# Patient Record
Sex: Female | Born: 1935 | Race: White | Hispanic: No | Marital: Married | Smoking: Never smoker
Health system: Southern US, Community
[De-identification: ages and names within clinical notes are randomized; demographics above are authoritative.]

## PROBLEM LIST (undated history)

## (undated) DIAGNOSIS — F0390 Unspecified dementia without behavioral disturbance: Secondary | ICD-10-CM

## (undated) DIAGNOSIS — A630 Anogenital (venereal) warts: Secondary | ICD-10-CM

## (undated) DIAGNOSIS — I6381 Other cerebral infarction due to occlusion or stenosis of small artery: Secondary | ICD-10-CM

## (undated) DIAGNOSIS — Z780 Asymptomatic menopausal state: Secondary | ICD-10-CM

## (undated) DIAGNOSIS — E785 Hyperlipidemia, unspecified: Secondary | ICD-10-CM

## (undated) DIAGNOSIS — I10 Essential (primary) hypertension: Secondary | ICD-10-CM

## (undated) DIAGNOSIS — E559 Vitamin D deficiency, unspecified: Secondary | ICD-10-CM

## (undated) DIAGNOSIS — L719 Rosacea, unspecified: Secondary | ICD-10-CM

## (undated) DIAGNOSIS — M810 Age-related osteoporosis without current pathological fracture: Secondary | ICD-10-CM

## (undated) DIAGNOSIS — R339 Retention of urine, unspecified: Secondary | ICD-10-CM

## (undated) DIAGNOSIS — I639 Cerebral infarction, unspecified: Secondary | ICD-10-CM

## (undated) HISTORY — DX: Unspecified dementia, unspecified severity, without behavioral disturbance, psychotic disturbance, mood disturbance, and anxiety: F03.90

## (undated) HISTORY — DX: Essential (primary) hypertension: I10

## (undated) HISTORY — DX: Cerebral infarction, unspecified: I63.9

## (undated) HISTORY — DX: Retention of urine, unspecified: R33.9

## (undated) HISTORY — DX: Anogenital (venereal) warts: A63.0

## (undated) HISTORY — DX: Asymptomatic menopausal state: Z78.0

## (undated) HISTORY — DX: Hyperlipidemia, unspecified: E78.5

## (undated) HISTORY — DX: Other cerebral infarction due to occlusion or stenosis of small artery: I63.81

## (undated) HISTORY — DX: Vitamin D deficiency, unspecified: E55.9

## (undated) HISTORY — DX: Age-related osteoporosis without current pathological fracture: M81.0

## (undated) HISTORY — DX: Rosacea, unspecified: L71.9

---

## 2004-09-02 ENCOUNTER — Ambulatory Visit: Payer: Self-pay | Admitting: Internal Medicine

## 2004-09-02 LAB — HM COLONOSCOPY

## 2005-10-27 LAB — HM PAP SMEAR

## 2006-04-11 ENCOUNTER — Ambulatory Visit: Payer: Self-pay | Admitting: Chiropractic Medicine

## 2013-04-30 ENCOUNTER — Observation Stay: Payer: Self-pay | Admitting: Internal Medicine

## 2013-04-30 LAB — BASIC METABOLIC PANEL
Anion Gap: 5 — ABNORMAL LOW (ref 7–16)
BUN: 14 mg/dL (ref 7–18)
Calcium, Total: 9.2 mg/dL (ref 8.5–10.1)
Co2: 28 mmol/L (ref 21–32)
Creatinine: 0.84 mg/dL (ref 0.60–1.30)
EGFR (African American): >60
EGFR (Non-African Amer.): >60
Glucose: 119 mg/dL — ABNORMAL HIGH (ref 65–99)
Osmolality: 277 (ref 275–301)
Potassium: 3.6 mmol/L (ref 3.5–5.1)

## 2013-04-30 LAB — CBC WITH DIFFERENTIAL/PLATELET
Basophil #: 0 10*3/uL (ref 0.0–0.1)
Basophil %: 0.3 %
Eosinophil #: 0.1 10*3/uL (ref 0.0–0.7)
Eosinophil %: 1.1 %
HCT: 41.9 % (ref 35.0–47.0)
HGB: 14.3 g/dL (ref 12.0–16.0)
Lymphocyte %: 16.5 %
MCH: 29.9 pg (ref 26.0–34.0)
MCHC: 34.1 g/dL (ref 32.0–36.0)
MCV: 88 fL (ref 80–100)
Monocyte #: 0.7 x10 3/mm (ref 0.2–0.9)
Monocyte %: 7.6 %
Neutrophil #: 7.3 10*3/uL — ABNORMAL HIGH (ref 1.4–6.5)
Neutrophil %: 74.5 %
RBC: 4.77 10*6/uL (ref 3.80–5.20)
RDW: 12.8 % (ref 11.5–14.5)

## 2013-04-30 LAB — TROPONIN I: Troponin-I: <0.02 ng/mL

## 2013-04-30 LAB — URINALYSIS, COMPLETE
Bacteria: NONE SEEN
Blood: NEGATIVE
Glucose,UR: NEGATIVE mg/dL (ref 0–75)
Hyaline Cast: 2
Ketone: NEGATIVE
Nitrite: NEGATIVE
RBC,UR: 1 /HPF (ref 0–5)
Squamous Epithelial: NONE SEEN
WBC UR: 1 /HPF (ref 0–5)

## 2013-06-02 HISTORY — PX: SUPRAPUBIC CATHETER PLACEMENT: SHX2473

## 2013-06-18 ENCOUNTER — Emergency Department: Payer: Self-pay | Admitting: Emergency Medicine

## 2013-06-18 LAB — CBC
HCT: 41.7 % (ref 35.0–47.0)
MCH: 29.5 pg (ref 26.0–34.0)
MCHC: 34.3 g/dL (ref 32.0–36.0)
MCV: 86 fL (ref 80–100)
RBC: 4.86 10*6/uL (ref 3.80–5.20)
RDW: 12.8 % (ref 11.5–14.5)
WBC: 18.3 10*3/uL — ABNORMAL HIGH (ref 3.6–11.0)

## 2013-06-18 LAB — COMPREHENSIVE METABOLIC PANEL
Albumin: 4.1 g/dL (ref 3.4–5.0)
Alkaline Phosphatase: 92 U/L (ref 50–136)
Anion Gap: 8 (ref 7–16)
BUN: 20 mg/dL — ABNORMAL HIGH (ref 7–18)
EGFR (Non-African Amer.): 39 — ABNORMAL LOW
Glucose: 121 mg/dL — ABNORMAL HIGH (ref 65–99)
Osmolality: 267 (ref 275–301)
SGOT(AST): 51 U/L — ABNORMAL HIGH (ref 15–37)
SGPT (ALT): 31 U/L (ref 12–78)
Sodium: 131 mmol/L — ABNORMAL LOW (ref 136–145)

## 2013-06-18 LAB — URINALYSIS, COMPLETE
Bacteria: NONE SEEN
Bilirubin,UR: NEGATIVE
Glucose,UR: NEGATIVE mg/dL (ref 0–75)
Ketone: NEGATIVE
Leukocyte Esterase: NEGATIVE
Nitrite: POSITIVE
Ph: 6 (ref 4.5–8.0)
Protein: NEGATIVE
RBC,UR: 132 /HPF (ref 0–5)
WBC UR: 5 /HPF (ref 0–5)

## 2013-06-19 LAB — URINE CULTURE

## 2013-06-26 ENCOUNTER — Emergency Department: Payer: Self-pay | Admitting: Emergency Medicine

## 2013-06-29 ENCOUNTER — Emergency Department: Payer: Self-pay | Admitting: Emergency Medicine

## 2013-06-29 LAB — URINALYSIS, COMPLETE
Ketone: NEGATIVE
Nitrite: NEGATIVE
Ph: 5 (ref 4.5–8.0)
RBC,UR: 249 /HPF (ref 0–5)
Specific Gravity: 1.018 (ref 1.003–1.030)
Squamous Epithelial: 3
WBC UR: 21 /HPF (ref 0–5)

## 2013-06-30 ENCOUNTER — Ambulatory Visit: Payer: Self-pay | Admitting: Urology

## 2013-06-30 LAB — CBC WITH DIFFERENTIAL/PLATELET
Basophil #: 0.1 10*3/uL (ref 0.0–0.1)
Eosinophil #: 0.4 10*3/uL (ref 0.0–0.7)
Eosinophil %: 5.1 %
HCT: 39.1 % (ref 35.0–47.0)
HGB: 13.4 g/dL (ref 12.0–16.0)
Lymphocyte %: 17.4 %
MCH: 30.3 pg (ref 26.0–34.0)
MCV: 88 fL (ref 80–100)
Monocyte %: 11.2 %
Neutrophil #: 5.5 10*3/uL (ref 1.4–6.5)
Platelet: 310 10*3/uL (ref 150–440)
RBC: 4.43 10*6/uL (ref 3.80–5.20)

## 2013-06-30 LAB — BASIC METABOLIC PANEL
Calcium, Total: 9 mg/dL (ref 8.5–10.1)
Co2: 31 mmol/L (ref 21–32)
EGFR (African American): >60
Osmolality: 271 (ref 275–301)
Sodium: 136 mmol/L (ref 136–145)

## 2013-07-07 ENCOUNTER — Observation Stay: Payer: Self-pay | Admitting: Internal Medicine

## 2013-07-07 LAB — COMPREHENSIVE METABOLIC PANEL
Alkaline Phosphatase: 106 U/L
Anion Gap: 9 (ref 7–16)
Calcium, Total: 8.9 mg/dL (ref 8.5–10.1)
Co2: 27 mmol/L (ref 21–32)
Creatinine: 0.64 mg/dL (ref 0.60–1.30)
EGFR (African American): >60
Glucose: 96 mg/dL (ref 65–99)
Potassium: 3.3 mmol/L — ABNORMAL LOW (ref 3.5–5.1)
SGOT(AST): 20 U/L (ref 15–37)
SGPT (ALT): 17 U/L (ref 12–78)
Sodium: 137 mmol/L (ref 136–145)
Total Protein: 6.1 g/dL — ABNORMAL LOW (ref 6.4–8.2)

## 2013-07-07 LAB — URINALYSIS, COMPLETE
Bacteria: NONE SEEN
Glucose,UR: NEGATIVE mg/dL (ref 0–75)
Nitrite: NEGATIVE
Protein: 100
RBC,UR: 1061 /HPF (ref 0–5)
Specific Gravity: 1.024 (ref 1.003–1.030)
WBC UR: 90 /HPF (ref 0–5)

## 2013-07-07 LAB — CBC WITH DIFFERENTIAL/PLATELET
Basophil %: 0.7 %
HCT: 37.6 % (ref 35.0–47.0)
Lymphocyte #: 1.6 10*3/uL (ref 1.0–3.6)
MCHC: 35 g/dL (ref 32.0–36.0)
MCV: 87 fL (ref 80–100)
Monocyte #: 0.9 x10 3/mm (ref 0.2–0.9)
Neutrophil #: 5.4 10*3/uL (ref 1.4–6.5)
Platelet: 280 10*3/uL (ref 150–440)
RBC: 4.31 10*6/uL (ref 3.80–5.20)
WBC: 8.4 10*3/uL (ref 3.6–11.0)

## 2013-07-08 ENCOUNTER — Ambulatory Visit: Payer: Self-pay | Admitting: Urology

## 2013-07-08 LAB — CBC WITH DIFFERENTIAL/PLATELET
Basophil #: 0 10*3/uL (ref 0.0–0.1)
Eosinophil #: 0.3 10*3/uL (ref 0.0–0.7)
Eosinophil %: 6.3 %
HGB: 12.9 g/dL (ref 12.0–16.0)
Lymphocyte #: 1.4 10*3/uL (ref 1.0–3.6)
Lymphocyte %: 27 %
MCH: 30.4 pg (ref 26.0–34.0)
MCV: 88 fL (ref 80–100)
Monocyte %: 13 %
Neutrophil #: 2.7 10*3/uL (ref 1.4–6.5)
RBC: 4.24 10*6/uL (ref 3.80–5.20)

## 2013-07-08 LAB — BASIC METABOLIC PANEL
BUN: 4 mg/dL — ABNORMAL LOW (ref 7–18)
Calcium, Total: 8.7 mg/dL (ref 8.5–10.1)
Chloride: 110 mmol/L — ABNORMAL HIGH (ref 98–107)
Co2: 30 mmol/L (ref 21–32)
Creatinine: 0.61 mg/dL (ref 0.60–1.30)
Glucose: 87 mg/dL (ref 65–99)
Osmolality: 278 (ref 275–301)
Potassium: 3.8 mmol/L (ref 3.5–5.1)
Sodium: 141 mmol/L (ref 136–145)

## 2013-07-13 ENCOUNTER — Emergency Department: Payer: Self-pay | Admitting: Emergency Medicine

## 2013-07-13 LAB — CBC WITH DIFFERENTIAL/PLATELET
Basophil #: 0 10*3/uL (ref 0.0–0.1)
Eosinophil #: 0.2 10*3/uL (ref 0.0–0.7)
HGB: 14.8 g/dL (ref 12.0–16.0)
Lymphocyte #: 1.4 10*3/uL (ref 1.0–3.6)
Lymphocyte %: 19.5 %
MCH: 29.9 pg (ref 26.0–34.0)
Monocyte %: 11.7 %
Neutrophil #: 4.7 10*3/uL (ref 1.4–6.5)
Neutrophil %: 65.3 %
Platelet: 304 10*3/uL (ref 150–440)
RDW: 12.7 % (ref 11.5–14.5)
WBC: 7.2 10*3/uL (ref 3.6–11.0)

## 2013-07-13 LAB — BASIC METABOLIC PANEL
Anion Gap: 6 — ABNORMAL LOW (ref 7–16)
BUN: 10 mg/dL (ref 7–18)
Co2: 25 mmol/L (ref 21–32)
EGFR (African American): >60
EGFR (Non-African Amer.): >60
Glucose: 94 mg/dL (ref 65–99)
Sodium: 134 mmol/L — ABNORMAL LOW (ref 136–145)

## 2013-10-25 ENCOUNTER — Emergency Department: Payer: Self-pay | Admitting: Emergency Medicine

## 2013-10-26 ENCOUNTER — Emergency Department: Payer: Self-pay | Admitting: Emergency Medicine

## 2013-10-27 LAB — CBC WITH DIFFERENTIAL/PLATELET
Basophil #: 0.1 10*3/uL (ref 0.0–0.1)
Basophil %: 0.7 %
Eosinophil #: 0.1 10*3/uL (ref 0.0–0.7)
Eosinophil %: 1.4 %
HCT: 39.3 % (ref 35.0–47.0)
HGB: 13.4 g/dL (ref 12.0–16.0)
Lymphocyte #: 1.6 10*3/uL (ref 1.0–3.6)
Lymphocyte %: 18.3 %
MCH: 30.2 pg (ref 26.0–34.0)
MCHC: 34.2 g/dL (ref 32.0–36.0)
MCV: 88 fL (ref 80–100)
MONO ABS: 0.9 x10 3/mm (ref 0.2–0.9)
Monocyte %: 10.8 %
Neutrophil #: 6 10*3/uL (ref 1.4–6.5)
Neutrophil %: 68.8 %
PLATELETS: 247 10*3/uL (ref 150–440)
RBC: 4.45 10*6/uL (ref 3.80–5.20)
RDW: 13.4 % (ref 11.5–14.5)
WBC: 8.7 10*3/uL (ref 3.6–11.0)

## 2013-10-27 LAB — BASIC METABOLIC PANEL
ANION GAP: 8 (ref 7–16)
BUN: 10 mg/dL (ref 7–18)
CO2: 25 mmol/L (ref 21–32)
CREATININE: 0.79 mg/dL (ref 0.60–1.30)
Calcium, Total: 8.6 mg/dL (ref 8.5–10.1)
Chloride: 104 mmol/L (ref 98–107)
EGFR (Non-African Amer.): >60
Glucose: 78 mg/dL (ref 65–99)
Osmolality: 272 (ref 275–301)
POTASSIUM: 3.5 mmol/L (ref 3.5–5.1)
SODIUM: 137 mmol/L (ref 136–145)

## 2013-10-27 LAB — URINALYSIS, COMPLETE
BACTERIA: NONE SEEN
Bilirubin,UR: NEGATIVE
GLUCOSE, UR: NEGATIVE mg/dL (ref 0–75)
Ketone: NEGATIVE
LEUKOCYTE ESTERASE: NEGATIVE
Nitrite: NEGATIVE
Ph: 7 (ref 4.5–8.0)
Protein: NEGATIVE
Specific Gravity: 1.002 (ref 1.003–1.030)
Squamous Epithelial: NONE SEEN

## 2013-12-17 ENCOUNTER — Ambulatory Visit: Payer: Self-pay | Admitting: Family Medicine

## 2014-02-27 ENCOUNTER — Observation Stay: Payer: Self-pay | Admitting: Internal Medicine

## 2014-02-27 LAB — CBC WITH DIFFERENTIAL/PLATELET
BASOS ABS: 0 10*3/uL (ref 0.0–0.1)
Basophil %: 0.3 %
Eosinophil #: 0.2 10*3/uL (ref 0.0–0.7)
Eosinophil %: 3.1 %
HCT: 43.6 % (ref 35.0–47.0)
HGB: 14.2 g/dL (ref 12.0–16.0)
LYMPHS ABS: 1.4 10*3/uL (ref 1.0–3.6)
LYMPHS PCT: 19.7 %
MCH: 29.6 pg (ref 26.0–34.0)
MCHC: 32.5 g/dL (ref 32.0–36.0)
MCV: 91 fL (ref 80–100)
MONO ABS: 0.7 x10 3/mm (ref 0.2–0.9)
Monocyte %: 9.6 %
NEUTROS PCT: 67.3 %
Neutrophil #: 4.9 10*3/uL (ref 1.4–6.5)
Platelet: 204 10*3/uL (ref 150–440)
RBC: 4.8 10*6/uL (ref 3.80–5.20)
RDW: 13 % (ref 11.5–14.5)
WBC: 7.3 10*3/uL (ref 3.6–11.0)

## 2014-02-27 LAB — URINALYSIS, COMPLETE
BILIRUBIN, UR: NEGATIVE
Blood: NEGATIVE
Glucose,UR: NEGATIVE mg/dL (ref 0–75)
Nitrite: NEGATIVE
Ph: 5 (ref 4.5–8.0)
Protein: NEGATIVE
Specific Gravity: 1.024 (ref 1.003–1.030)
WBC UR: 2 /HPF (ref 0–5)

## 2014-02-27 LAB — BASIC METABOLIC PANEL
Anion Gap: 6 — ABNORMAL LOW (ref 7–16)
BUN: 14 mg/dL (ref 7–18)
CREATININE: 0.7 mg/dL (ref 0.60–1.30)
Calcium, Total: 8.8 mg/dL (ref 8.5–10.1)
Chloride: 105 mmol/L (ref 98–107)
Co2: 30 mmol/L (ref 21–32)
EGFR (African American): >60
GLUCOSE: 82 mg/dL (ref 65–99)
Osmolality: 281 (ref 275–301)
POTASSIUM: 4 mmol/L (ref 3.5–5.1)
SODIUM: 141 mmol/L (ref 136–145)

## 2014-02-28 LAB — CBC WITH DIFFERENTIAL/PLATELET
Basophil #: 0.1 10*3/uL (ref 0.0–0.1)
Basophil %: 0.6 %
Eosinophil #: 0.2 10*3/uL (ref 0.0–0.7)
Eosinophil %: 2.7 %
HCT: 42.2 % (ref 35.0–47.0)
HGB: 14 g/dL (ref 12.0–16.0)
Lymphocyte #: 1.4 10*3/uL (ref 1.0–3.6)
Lymphocyte %: 16.6 %
MCH: 30.3 pg (ref 26.0–34.0)
MCHC: 33.2 g/dL (ref 32.0–36.0)
MCV: 91 fL (ref 80–100)
Monocyte #: 0.8 x10 3/mm (ref 0.2–0.9)
Monocyte %: 9 %
Neutrophil #: 6 10*3/uL (ref 1.4–6.5)
Neutrophil %: 71.1 %
PLATELETS: 196 10*3/uL (ref 150–440)
RBC: 4.61 10*6/uL (ref 3.80–5.20)
RDW: 13.2 % (ref 11.5–14.5)
WBC: 8.4 10*3/uL (ref 3.6–11.0)

## 2014-02-28 LAB — BASIC METABOLIC PANEL
Anion Gap: 9 (ref 7–16)
BUN: 11 mg/dL (ref 7–18)
Calcium, Total: 8.5 mg/dL (ref 8.5–10.1)
Chloride: 106 mmol/L (ref 98–107)
Co2: 26 mmol/L (ref 21–32)
Creatinine: 0.61 mg/dL (ref 0.60–1.30)
Glucose: 86 mg/dL (ref 65–99)
OSMOLALITY: 280 (ref 275–301)
Potassium: 3.6 mmol/L (ref 3.5–5.1)
SODIUM: 141 mmol/L (ref 136–145)

## 2014-11-22 NOTE — H&P (Signed)
PATIENT NAME:  [name], Felicia S MR#:  [id number] DATE OF BIRTH:  18-Aug-1927  DATE OF ADMISSION:  07/07/2013  PRIMARY CARE PHYSICIAN:  Dr. Dossie Arbourrissman.  REFERRING EMERGENCY ROOM PHYSICIAN:  Dr. Manson PasseyBrown.   CHIEF COMPLAINT: Altered mental status, lower abdominal discomfort.   HISTORY OF PRESENT ILLNESS: The patient is an 81 year old pleasant Caucasian female with a history of urinary retention after surgical procedure requiring indwelling Foley catheter,  follows up with Urology, her last visit was yesterday and dementia. She  is presenting to the ER with a chief complaint of confusion and lower abdominal discomfort. The patient lives with her husband, who is demented. The patient also has baseline dementia per ER physician. The patient and her husband came to the ER for lower abdominal discomfort. She is not feverish but having chills. Denies any nausea, vomiting, back pain. CAT scan of the abdomen and pelvis with contrast was ordered, which is pending at this time.   PAST MEDICAL HISTORY: Urinary retention, dementia per the old records.   PAST SURGICAL AND PROCEDURAL HISTORY: Indwelling Foley catheter for urinary retention.  ALLERGIES:  No known drug allergies.   PSYCHOSOCIAL HISTORY: Lives at home with husband, who is demented. No history of  smoking, alcohol or drugs.   FAMILY HISTORY: The patient's dad had history of congestive heart failure.    REVIEW OF SYSTEMS: CONSTITUTIONAL:  Denies any fever, complaining of chills. No fever or fatigue.   EYES:  Denies any blurry vision or double vision.  EARS, NOSE, THROAT: No epistaxis, discharge.  RESPIRATION: Denies cough or COPD.   CARDIOVASCULAR: No chest pain, palpitations.  GASTROINTESTINAL: Denies nausea, vomiting, diarrhea. Complaining of lower abdominal discomfort.  NEUROLOGIC: Denies any vertigo, ataxia. Has dementia. PSYCHIATRIC:  No ADD or OCD.  HOME MEDICATIONS: Cipro 500 mg p.o. q.12 hours, Percocet as needed basis.   PHYSICAL  EXAMINATION: VITAL SIGNS: Temperature 98.6, pulse 81, respirations 20, blood pressure 140/87, pulse oximetry 97%.  GENERAL APPEARANCE: Not under acute distress. Moderately built and nourished.  HEENT: Normocephalic, atraumatic. Pupils are equally reacting to light and accommodation. No scleral icterus. No conjunctival injection. No sinus tenderness. No postnasal drip.  NECK: Supple. No JVD. No thyromegaly. Range of motion is intact.  LUNGS: Clear to auscultation bilaterally. No accessory muscle use and no anterior chest wall tenderness on palpation.  CARDIAC: S1, S2 normal. Regular rate and rhythm. No murmurs. GASTROINTESTINAL: Soft. Bowel sounds are positive in all 4 quadrants. Nontender, nondistended. No hepatosplenomegaly. No masses felt.  NEUROLOGIC: Awake, alert, oriented x 3. Motor and sensory are intact  EXTREMITIES: No edema. No cyanosis. No clubbing.  MUSCULOSKELETAL: No joint effusion, tenderness, erythema. PSYCHIATRIC:  Normal mood and affect.   LABORATORY AND IMAGING STUDIES: CAT scan of the abdomen and pelvis is pending which needs to be followed up by the rounding physician. Except for potassium, BMP looks fine. LFTs are normal except total protein which is low at 6.1, albumin is low at 3.2. CBC: WBC 8.4, hemoglobin is 13.1, hematocrit 37.6, platelets 280. Urinalysis is amber color, cloudy in appearance. Glucose negative, bilirubin negative, ketones negative, specific gravity 1.024, protein 100 mg/dL, nitrite negative, leukocyte esterase 2+.     ASSESSMENT AND PLAN: An 81 year old Caucasian female presenting to the ER with worsening of mentation will be admitted with following assessment and plan : 1.  Altered mental status, probably from acute cystitis. We will admit the patient and get  cultures. The patient will be on IV Rocephin. Continue  indwelling Foley catheter and if  necessary, we will put a Urology consult.  2.  Chronic urinary retention with indwelling Foley catheter. Seen  by Urology yesterday as reported by the patient with chronic history of dementia. We will provide her gastrointestinal and deep vein thrombosis prophylaxis.   CODE STATUS: She is FULL CODE until code status is determined.   TOTAL TIME SPENT ON ADMISSION: 45 minutes.   The diagnosis and plan of care was discussed in detail with the patient and her husband at bedside.     ____________________________ Ramonita Lab, MD ag:dp D: 07/07/2013 06:18:40 ET T: 07/07/2013 07:31:17 ET JOB#: [id number]  cc: Ramonita Lab, MD, <Dictator> Steele Sizer, MD Ramonita Lab MD ELECTRONICALLY SIGNED 07/12/2013 5:48

## 2014-11-22 NOTE — Consult Note (Signed)
Urology Procedure Note: Complicated Foley Catheter Placement (due to altered anatomy)  Chronic Urinary Retention (failed voiding trial this morning - foley d/c'd at 11am - pt unable to void and c/o 10/10 lower abd pain)                 -nursing unable to identify the urethral meatus to replace foley catheter Vaginal atrophy with moderate to severe introital stenosis. Marked edema of the urethral meatus with mild recession and inferolateral orientation toward the left. The pt was prepped/draped sterilely. A 16 French Silicone Foley catheter was placed to straight drainage after the edematous, recessed urethral meatus was identified with manual palpation.  >83000mL of clear amber urine obtained with prompt relief of abd discomfort. Maintain foley catheter until [facility]  Electronic Signatures: Marin OlpKim, Katalin Colledge H (MD)  (Signed on 07-Dec-14 14:55)  Authored  Last Updated: 07-Dec-14 14:55 by Marin OlpKim, Savien Mamula H (MD)

## 2014-11-22 NOTE — H&P (Signed)
PATIENT NAME:  Felicia [name], Felicia [name] MR#:  [id number] DATE OF BIRTH:  05-Mar-1928  DATE OF ADMISSION:  04/30/2013  PRIMARY CARE PHYSICIAN: Dr. Dossie Arbourrissman.   CHIEF COMPLAINT: Back pain after fall yesterday. Felicia [name] is a very pleasant 81 year old Caucasian female with no past medical history, who does not take any medications other than ginkgo biloba comes to the Emergency Room after she accidently rolled over the couch while she was napping yesterday. She has chronic low back pain and since after the fall she thinks that her back got injured and her back has been hurting and she has been weak,  not able to ambulate well. Her son brought her to the Emergency Room where she has been hemodynamically stable. Her x-ray of the AP lumbar spine is negative for any fractures, shows DJD. The patient was evaluated, did received a small dose of Roxicodone 2.5 mg. Thereafter she seemed a little confused "talking some out of context." The patient was seen also by physical therapy and  per PT the patient did not seem to be safe to return home to stay with the husband who has medical problems, along with who uses a rolling walker. The patient does not have any other help at home. Hence, is being admitted for further evaluation and management.   PAST MEDICAL HISTORY: None.   PAST SURGICAL HISTORY: None.   MEDICATIONS: The patient does not take any medications other than ginkgo biloba tablets daily.   ALLERGIES: No known drug allergies.   SOCIAL HISTORY: Lives with her husband. Nonsmoker, nonalcoholic.   FAMILY HISTORY: The patient does not remember and is unable to tell me anything about her family history.   REVIEW OF SYSTEMS:  CONSTITUTIONAL: No fever. Positive for fatigue, weakness and back pain.  EYES: No blurred or double vision, glaucoma or cataracts.   ENT: No tinnitus, ear pain, hearing loss or postnasal drip.  RESPIRATORY: No cough, wheeze, hemoptysis or dyspnea.  CARDIOVASCULAR: No chest pain, orthopnea, edema  or arrhythmia, palpitations.  GASTROINTESTINAL: No nausea, vomiting, diarrhea, abdominal pain or melena.  GENITOURINARY: No dysuria or hematuria.  ENDOCRINE: No polyuria, nocturia or thyroid problems.  HEMATOLOGY: No anemia or easy bruising.  SKIN: No acne or rash.  MUSCULOSKELETAL: Positive for back pain and arthritis. No swelling or gout.  NEUROLOGICAL: No CVA, TIA, dysarthria or dementia. PSYCHOLOGICAL: No anxiety or depression.   All other systems reviewed and negative.   PHYSICAL EXAMINATION: GENERAL: The patient is awake, alert, oriented x 3, not in acute distress.  VITAL SIGNS: Afebrile. Pulse is 55, respirations 18, blood pressure is 133/58, sats are 98% on room air.  HEENT: Atraumatic, normocephalic. Pupils: PERRLA. EOM intact. Oral mucosa is moist.  NECK: Supple. No JVD. No carotid bruit.  LUNGS: Clear to auscultation bilaterally. No rales, rhonchi, respiratory distress or labored breathing.  HEART: Both the heart sounds are normal. Rate, rhythm regular. PMI not lateralized. Chest is nontender.  EXTREMITIES: Good pedal pulses, good femoral pulses. No lower extremity edema.  ABDOMEN: Soft, benign, nontender. No organomegaly. Positive bowel sounds.  NEUROLOGIC: The patient is right-handed. Speech clear. No dysarthria. No facial droop. The rest of the cranial nerves remained grossly intact. Motor system exam 4+/5 in both upper and lower extremities. Sensory exam within normal limits. Gait deferred. Reflexes 1+ and deep tendon jerks 1+ in both upper and lower extremity. The patient is alert, oriented x 3 at this time.  PSYCHIATRIC: The patient is awake. Mood and affect are normal. She is alert and oriented  x 3.   Cardiac enzymes first set negative.   CT of the head: No acute intracranial process.   AP lumbar x-ray shows mild degenerative disk changes in the lower lumbar spine. No evidence of compression fracture.   back appears normal. Coccyx was not included in the full view.    Urinalysis negative for urinary tract infection.   CBC within normal limits. Basic metabolic panel within normal limits.   EKG shows normal sinus rhythm.   ASSESSMENT: An 81 year old Felicia [name] with no significant past medical history, comes in after she had a mechanical fall while she rolled out of her couch when napping yesterday evening. She started having increasing back pain, came to the Emergency Room and is being admitted for:  1. Acute on chronic low back pain with some bilateral lower extremity subjective weakness. The patient does not have any focal neuro deficit. She is alert, oriented x 3. Speech is clear.No evidence of CVA The patient was seen and evaluated by physical therapy who recommends the patient is unsafe to return home given her lower extremity weakness, which was aggravated by low back pain secondary  to mechanical fall. We will admit the patient to ortho floor and continue physical therapy. Care management will help with discharge planning, likely to rehab.  2. Altered mental status transiently in the Emergency Room. The patient is alert, oriented x3 during my evaluation, her CT head is negative for cerebrovascular accident. We will continue monitoring the patient'[name] mentation. Consider further work-up for possible stroke if mentation continues to wax and wane. I will hold off on any further imaging studies at this time.  3. Low back pain.  4. Back pain. We will give p.r.n. Ultram for back pain.  5. Deep vein thrombosis prophylaxis with subcutaneous heparin.  6. Further work-up per the patient'[name] clinical course. Hospital admission plan was discussed with the patient. No family members present during my evaluation in the Emergency Room.   TIME SPENT: 50 minutes.   CODE STATUS: FULL CODE.    ____________________________ Wylie Hail. Allena Katz, MD sap:sg D: 04/30/2013 18:25:00 ET T: 04/30/2013 19:00:32 ET JOB#: [id number]  cc: Dr. Shelly Rubenstein A. Allena Katz, MD,  <Dictator>   Willow Ora MD ELECTRONICALLY SIGNED 05/01/2013 14:24

## 2014-11-22 NOTE — Discharge Summary (Signed)
PATIENT NAME:  Felicia [name], Felicia [name] MR#:  [id number] DATE OF BIRTH:  [birth year]  DATE OF ADMISSION:  07/07/2013  DATE OF DISCHARGE:  07/08/2013  PRIMARY CARE PHYSICIAN: Dr. Dossie Arbourrissman  DISCHARGE DIAGNOSES: 1.  Altered mental status, probably from acute cystitis.  2.  Chronic urinary retention, with indwelling Foley catheter.  3.  Dementia.   CONDITION: Stable.   CODE STATUS: FULL CODE.   HOME MEDICATIONS: Cipro 500 mg p.o. q. 12 hours for 5 days.   INSTRUCTIONS: Diet: Regular diet. Activity as tolerated. Followup care: Follow up with PCP within 1 to 2 weeks. Follow up with Urology within 1 to 2 weeks.   REASON FOR ADMISSION: Altered mental status and lower abdominal discomfort.   HOSPITAL COURSE: The patient is an 81 year old Caucasian female with a history of urinary retention after a surgical procedure of  Foley catheter insertion. She came to ED due to confusion and lower abdominal discomfort. CAT scan of the abdomen and pelvis with contrast showed cystitis. For detailed history and physical examination, please refer to the admission note dictated by Dr. Amado CoeGouru. Laboratory data was unremarkable. After admission, patient has been treated with Rocephin IV. The patient'[name] altered mental status has much improved. She is alert, awake, oriented. Vital signs are stable. Physical examination is unremarkable.   The patient is clinically stable, and will be discharged back to home with a Foley catheter. The patient needs to follow up with Urology as outpatient. I discussed the patient'[name] discharge plan with the patient and the nurse.   TIME SPENT: About 33 minutes.    ____________________________ Shaune PollackQing Ahlijah Raia, MD qc:mr D: 07/08/2013 12:32:09 ET T: 07/08/2013 20:10:16 ET JOB#: [id number]  cc: Shaune PollackQing Dagmawi Venable, MD, <Dictator> Shaune PollackQING Anzal Bartnick MD ELECTRONICALLY SIGNED 07/09/2013 17:07

## 2014-11-22 NOTE — H&P (Signed)
Subjective/Chief Complaint Urinary retention   History of Present Illness [name] is an 81 year old female who went into urinary retention after a surgical procedure requiring foley placement.  She came into the ER last night with a foley that was not draining.  Her catheter was removed and the ED staff were unable to replace it.  Urology called for catheter placement.   Past Med/Surgical Hx:  Negative, patient denies medical history.:   ALLERGIES:  No Known Allergies:   Family and Social History:  Family History Non-Contributory   Social History negative tobacco   Review of Systems:  Fever/Chills No   Cough No   Sputum No   Abdominal Pain Yes  urinary retention   Diarrhea No   Constipation No   Nausea/Vomiting No   SOB/DOE No   Chest Pain No   Telemetry Reviewed NSR   Dysuria No   Tolerating PT Yes   Tolerating Diet Yes   Medications/Allergies Reviewed Medications/Allergies reviewed   Physical Exam:  GEN no acute distress   HEENT PERRL   NECK supple   RESP normal resp effort   CARD regular rate   ABD denies Flank Tenderness  soft   GU superpubic tenderness   LYMPH negative neck   EXTR negative cyanosis/clubbing   SKIN normal to palpation   NEURO cranial nerves intact   PSYCH alert   Lab Results: Routine Chem:  29-Nov-14 00:26   Glucose, Serum 91  BUN 11  Creatinine (comp) 0.73  Sodium, Serum 136  Potassium, Serum 4.1  Chloride, Serum 100  CO2, Serum 31  Calcium (Total), Serum 9.0  Anion Gap  5  Osmolality (calc) 271  eGFR (African American) >60  eGFR (Non-African American) >60 (eGFR values <60mL/min/1.73 m2 may be an indication of chronic kidney disease (CKD). Calculated eGFR is useful in patients with stable renal function. The eGFR calculation will not be reliable in acutely ill patients when serum creatinine is changing rapidly. It is not useful in  patients on dialysis. The eGFR calculation may not be applicable to  patients at the low and high extremes of body sizes, pregnant women, and vegetarians.)  Routine Hem:  29-Nov-14 00:26   WBC (CBC) 8.4  RBC (CBC) 4.43  Hemoglobin (CBC) 13.4  Hematocrit (CBC) 39.1  Platelet Count (CBC) 310  MCV 88  MCH 30.3  MCHC 34.4  RDW 12.4  Neutrophil % 65.5  Lymphocyte % 17.4  Monocyte % 11.2  Eosinophil % 5.1  Basophil % 0.8  Neutrophil # 5.5  Lymphocyte # 1.5  Monocyte # 0.9  Eosinophil # 0.4  Basophil # 0.1 (Result(s) reported on 30 Jun 2013 at 12:38AM.)    Assessment/Admission Diagnosis Urinary retention   Plan -Under sterile conditions a 16 Fr Foley catheter was placed with minimal difficulty.  Patient has atrophic vaginitis and some urethral retraction which explains the difficulty experienced in prior attempts.  Patient will follow up with her home urologist.   Electronic Signatures: ,  J (MD)  (Signed 29-Nov-14 09:30)  Authored: CHIEF COMPLAINT and HISTORY, PAST MEDICAL/SURGIAL HISTORY, ALLERGIES, FAMILY AND SOCIAL HISTORY, REVIEW OF SYSTEMS, PHYSICAL EXAM, LABS, ASSESSMENT AND PLAN   Last Updated: 29-Nov-14 09:30 by ,  J (MD) 

## 2014-11-22 NOTE — Discharge Summary (Signed)
PATIENT NAME:  Felicia [name], Felicia [name] MR#:  [id number] DATE OF BIRTH:  01-Apr-1928  DATE OF ADMISSION:  04/30/2013 DATE OF DISCHARGE:  05/01/2013  DISCHARGE DIAGNOSES: 1.  Acute on chronic back pain.  2.  Acute encephalopathy secondary to her narcotics.   CONSULTS: None.   IMAGING STUDIES DONE: Include a CT scan of the head without contrast, which showed no acute abnormalities.   X-ray of lumbar spine showed degenerative changes. No acute fracture or  dislocation.   ADMITTING HISTORY AND PHYSICAL: Please see detailed H and P dictated by Dr. Allena KatzPatel. In brief, an 81 year old female patient with a history of chronic back pain, presented to the hospital after she slipped off her bed and had acute pain. In the Emergency Room, the patient was give some narcotics, was confused, unable to walk and admitted to hospitalist service. The patient was seen by physical therapy in the hospital and was thought to be needing a skilled nursing facility or home health per the patient'[name] choice. The patient was offered both the choices but she refused skilled nursing facility as her husband was sick and she needs to take care of him. She was offered home health so that she can get further physical therapy at home, which also the patient refused.   Prior to discharge, the patient did walk but needed some assistance. She was discharged home to be managed by family with help as she refused home health or a skilled nursing facility.   The patient'[name] encephalopathy was secondary to narcotics which had resolved after pain medication had worn off.   DISCHARGE MEDICATIONS: Include:  1.  Tramadol 50 mg oral every 8 hours as needed for pain.  2.  Acetaminophen 650 mg oral every 4 hours as needed for pain.   DISCHARGE INSTRUCTIONS: Regular diet. Regular activity with assistance. Follow up with primary care physician in 1 to 2 weeks. Do not lift anything heavy. Use a walker all the time.      ____________________________ Molinda BailiffSrikar R.  Kadynce Bonds, MD srs:dp D: 05/05/2013 16:13:16 ET T: 05/05/2013 16:28:29 ET JOB#: [id number]  cc: Wardell HeathSrikar R. Sanuel Ladnier, MD, <Dictator> Orie FishermanSRIKAR R Dreamer Carillo MD ELECTRONICALLY SIGNED 05/06/2013 2:35

## 2014-11-23 NOTE — H&P (Signed)
PATIENT NAME:  Felicia [name], Felicia [name] MR#:  [id number] DATE OF BIRTH:  November 28, 1927  DATE OF ADMISSION:  02/27/2014  ADMITTING PHYSICIAN: Enid Baas, M.D.   PRIMARY CARE PHYSICIAN: Dr. Dossie Arbour.   CHIEF COMPLAINT: Fall and pelvic pain.   HISTORY OF PRESENT ILLNESS: Felicia [name] is a very pleasant 81 year old Caucasian female with past medical history significant for hypertension, mild dementia, history of urinary retention in the past, presents to the hospital secondary to worsening pain after a fall a couple of days ago. The patient states that she was walking out from a restaurant and fell on the concrete floor and since then, she has been having trouble with walking at home. She could not stand to bear weight on her leg. She states her ankle hurts and also her left buttock region hurts when she tries to walk and she has being crawling to the bathroom pretty much since yesterday. She lives at home with her disabled husband who has spinal stenosis and cannot help himself at baseline. So, daughter brought her to the hospital and x-ray shows she has left inferior pubic rami fracture. So, she is being admitted under observation for pain control and physical therapy.   PAST MEDICAL HISTORY:  1. Hypertension.  2. Mild dementia.  3. History of urinary retention, now resolved.    PAST SURGICAL HISTORY: None.   ALLERGIES TO MEDICATIONS: No known drug allergies.   CURRENT HOME MEDICATIONS:  1. Aspirin 81 mg p.o. daily.  2. Lexapro 5 mg p.o. daily.  3. Losartan 25 mg p.o. daily.  4. Vitamin B 12 one thousand  mcg p.o. daily.  5. Vitamin D 2000 international units p.o. daily.   SOCIAL HISTORY: Lives at home with her husband, who has spinal stenosis and is also disabled. The patient prior to the fall was not using any cane or walker to walk. No history of smoking or alcohol or drug use.   FAMILY HISTORY: Significant for congestive heart failure.   REVIEW OF SYSTEMS: CONSTITUTIONAL: No fever, fatigue, or  weakness.  EYES: No blurred vision, double vision, inflammation or glaucoma.  ENT: No tinnitus, ear pain, hearing loss, epistaxis or discharge.  RESPIRATORY: No cough, wheeze, hemoptysis or chronic obstructive pulmonary disease.  CARDIOVASCULAR: No chest pain, orthopnea, edema, arrhythmia, palpitations, or syncope.  GASTROINTESTINAL: No nausea, vomiting, diarrhea, abdominal pain, hematemesis, or melena.  GENITOURINARY: No dysuria, hematuria, renal calculus, frequency or incontinence.  ENDOCRINE: No polyuria, nocturia,  thyroid problems, heat or cold intolerance.  HEMATOLOGY: No anemia, easy bruising or bleeding.  SKIN: No acne, rash or lesions.  MUSCULOSKELETAL: No neck, back, shoulder pain, arthritis or gout.  NEUROLOGIC: No numbness, weakness, CVA, TIA or seizures.  PSYCHOLOGICAL: No anxiety, insomnia or depression.   PHYSICAL EXAMINATION:  VITAL SIGNS: Temperature 98.2 degrees Fahrenheit. Pulse 56, respirations 18, blood pressure 139/69, pulse oximetry 94% on room air.  GENERAL: Well-built, well-nourished female lying in bed, not in any acute distress.  HEENT: Normocephalic, atraumatic. Pupils equal, round, reacting to light. Anicteric sclerae. Extraocular movements intact. Oropharynx clear without erythema, mass or icterus.  NECK: Is supple without thyromegaly, JVD or carotid bruits. No lymphadenopathy.  LUNGS: Moving air bilaterally. No wheeze or crackles. No use of accessory muscles for breathing.  CARDIOVASCULAR: S1, S2, regular rate and rhythm. No murmurs, rubs, or gallops.  ABDOMEN: Soft, nontender, nondistended. No hepatosplenomegaly. Normal bowel sounds.  EXTREMITIES: No pedal edema. No clubbing or cyanosis. There are 2+ dorsalis pedis pulses palpable bilaterally.  SKIN: No acne, rash, or lesions.  LYMPHATICS: No cervical lymphadenopathy.  NEUROLOGIC: Cranial nerves intact. No focal motor or sensory deficits.   PSYCHOLOGICAL: The patient is awake, alert, oriented x 3.    LABORATORY DATA: WBC 7.3, hemoglobin 14.3, hematocrit 43.2, platelet count 204,000.   Sodium 141, potassium 4.0, chloride 105, bicarbonate 30 BUN 14, creatinine 0.7, glucose 82, calcium of 8.8.   Urinalysis negative for any infection. CT of the pelvis without contrast showing minimally displaced fracture of left inferior pubic ramus and left hip x-rays were normal actually.    ASSESSMENT AND PLAN: This is an 81 year old female with hypertension, deme98ntia, brought in after a fall and pelvic fracture.   1. Mechanical fall with left inferior pubic ramus fracture and unable to care for herself at home. Nonoperable fracture. Will admit for pain control and also physical therapy.     2. Dementia. The patient appears to be at baseline. Continue her Lexapro for depression. 3. Hypertension. On losartan.  4. Deep vein thrombosis prophylaxis, on subcutaneous heparin.   CODE STATUS: Full code.   TIME SPENT ON ADMISSION: 50 minutes.    ____________________________ Enid Baasadhika Jahmir Salo, MD rk:jh D: 02/27/2014 20:33:26 ET T: 02/27/2014 21:26:37 ET JOB#: [id number]  cc: Enid Baasadhika Yuvraj Pfeifer, MD, <Dictator> Steele SizerMark A. Crissman, MD  Enid BaasADHIKA Zinia Innocent MD ELECTRONICALLY SIGNED 03/19/2014 13:48

## 2014-11-23 NOTE — Discharge Summary (Signed)
PATIENT NAME:  Felicia [name], Felicia [name] MR#:  [id number] DATE OF BIRTH:  January 10, 1928  DATE OF ADMISSION:  02/27/2014 DATE OF DISCHARGE:    ADMITTING PHYSICIAN: Enid Baasadhika Jadalynn Burr, M.D.   DISCHARGING PHYSICIAN: Enid Baasadhika Timathy Newberry, M.D.   PRIMARY CARE PHYSICIAN: Vonita MossMark Crissman, M.D.   CONSULTATIONS IN HOSPITAL: None.   DISCHARGE DIAGNOSES:  1. Fall and left inferior pubic ramus fracture.  2. Hypertension.  3. Dementia.   DISCHARGE HOME MEDICATIONS: 1. Aspirin 81 mg p.o. daily.  2. Lexapro 5 mg p.o. daily.  3. Losartan 25 mg p.o. daily.  4. Vitamin D3 2000 international units p.o. daily.  5. Vitamin B12 1000 mcg p.o. daily.  6. Norco 5/325 mg 1 tablet q.8 h. p.r.n. for pain.  7. Colace 100 mg p.o. b.i.d.  8. Milk of magnesia 30 mL daily p.r.n. for constipation.   DISCHARGE DIET: Low sodium diet.  DISCHARGE ACTIVITY: As tolerated.  FOLLOWUP INSTRUCTIONS: PCP followup in 1 week and physical therapy.   LABORATORIES AND IMAGING STUDIES PRIOR TO DISCHARGE: WBC 8.4, hemoglobin 14.3, hematocrit 43.2, platelet count 196,000.   Sodium 141, potassium 3.6, chloride 106, bicarbonate 26, BUN 11, creatinine 0.61, glucose 86, calcium of 8.5.   CT of the pelvis without contrast showing minimally displaced fracture of the left inferior pubic ramus.   Urinalysis negative for any infection.   Left hip x-ray showed normal left hip.  BRIEF HOSPITAL COURSE: [name] is an 81 year old, elderly Caucasian female, with history of dementia, hypertension, who presents to the hospital after she had a mechanical fall and had a left inferior pubic ramus fracture with difficulty ambulating at home.   1. Fall and left inferior pubic ramus fracture. The patient fell actually 2 days prior to admission; however, was dragging herself at home, could not ambulate, and unable to care for her disabled husband, so her daughter brought her to the hospital. It is a nonoperable site, so pain control achieved. Physical therapy  recommended rehabilitation and the patient is awaiting a rehabilitation bed at this time. 2. Hypertension. Continue losartan. 3. Dementia. The patient does do sundowning, very confused, has not required any Haldol or  Ativan here. Just reassuring and redirecting her is needed at this time. She is on low-dose Lexapro for possible depression.  4. Constipation while taking pain medications. The patient is on Colace and milk of magnesia p.r.n.   Her course has been otherwise uneventful in the hospital.   DISCHARGE CONDITION: Stable.   DISCHARGE DISPOSITION: To short-term rehabilitation.   TIME SPENT ON DISCHARGE: 40 minutes.    ____________________________ Enid Baasadhika Elexa Kivi, MD rk:jr D: 03/01/2014 11:47:57 ET T: 03/01/2014 14:08:57 ET JOB#: [id number]  cc: Enid Baasadhika Finian Helvey, MD, <Dictator> Steele SizerMark A. Crissman, MD Enid BaasADHIKA Jameire Kouba MD ELECTRONICALLY SIGNED 03/19/2014 13:52

## 2015-01-15 ENCOUNTER — Other Ambulatory Visit: Payer: Self-pay | Admitting: Family Medicine

## 2015-01-15 NOTE — Telephone Encounter (Signed)
E-Fax came through for refill: Rx: Escitalopram 5mg  tab Pharmacy: [facility]

## 2015-01-15 NOTE — Telephone Encounter (Signed)
Medications added to order

## 2015-01-15 NOTE — Telephone Encounter (Signed)
Please clarify and load into medicines and back to me

## 2015-01-15 NOTE — Telephone Encounter (Signed)
E-Fax came through for refill: Rx: Losartan potassium 25 gm tab

## 2015-01-16 ENCOUNTER — Telehealth: Payer: Self-pay | Admitting: Family Medicine

## 2015-01-16 MED ORDER — LOSARTAN POTASSIUM 25 MG PO TABS: 25.0000 mg | ORAL_TABLET | Freq: Every day | ORAL | Status: DC

## 2015-01-16 MED ORDER — ESCITALOPRAM OXALATE 5 MG PO TABS: 5.0000 mg | ORAL_TABLET | Freq: Every day | ORAL | Status: DC

## 2015-01-16 NOTE — Telephone Encounter (Signed)
Spoke with pt she will call back and schedule an appt after talking to her daughter.

## 2015-01-16 NOTE — Telephone Encounter (Signed)
Please let LAVASHA STACHE know that I'd like to see patient for an appointment here in the office for:  Htn, mood, cholesterol Please schedule a visit with me  in the next: month Fasting?  Not necessary Thank you, Dr. Sherie Don

## 2015-02-17 ENCOUNTER — Telehealth: Payer: Self-pay | Admitting: Family Medicine

## 2015-02-17 NOTE — Telephone Encounter (Signed)
Patient needs an appointment please; see note from last month She has dementia; please talk to a family member

## 2015-02-26 NOTE — Telephone Encounter (Signed)
Pt schedule appt on 8/19.

## 2015-03-07 DIAGNOSIS — M810 Age-related osteoporosis without current pathological fracture: Secondary | ICD-10-CM | POA: Insufficient documentation

## 2015-03-07 DIAGNOSIS — F0390 Unspecified dementia without behavioral disturbance: Secondary | ICD-10-CM | POA: Insufficient documentation

## 2015-03-07 DIAGNOSIS — I639 Cerebral infarction, unspecified: Secondary | ICD-10-CM | POA: Insufficient documentation

## 2015-03-07 DIAGNOSIS — R339 Retention of urine, unspecified: Secondary | ICD-10-CM | POA: Insufficient documentation

## 2015-03-07 DIAGNOSIS — E785 Hyperlipidemia, unspecified: Secondary | ICD-10-CM | POA: Insufficient documentation

## 2015-03-07 DIAGNOSIS — A630 Anogenital (venereal) warts: Secondary | ICD-10-CM | POA: Insufficient documentation

## 2015-03-07 DIAGNOSIS — L719 Rosacea, unspecified: Secondary | ICD-10-CM | POA: Insufficient documentation

## 2015-03-07 DIAGNOSIS — I6381 Other cerebral infarction due to occlusion or stenosis of small artery: Secondary | ICD-10-CM | POA: Insufficient documentation

## 2015-03-07 DIAGNOSIS — E559 Vitamin D deficiency, unspecified: Secondary | ICD-10-CM | POA: Insufficient documentation

## 2015-03-07 DIAGNOSIS — I10 Essential (primary) hypertension: Secondary | ICD-10-CM | POA: Insufficient documentation

## 2015-03-20 ENCOUNTER — Other Ambulatory Visit: Payer: Self-pay | Admitting: Family Medicine

## 2015-03-20 NOTE — Telephone Encounter (Signed)
Routing to provider  

## 2015-03-21 ENCOUNTER — Encounter: Payer: Self-pay | Admitting: Family Medicine

## 2015-03-21 ENCOUNTER — Ambulatory Visit (INDEPENDENT_AMBULATORY_CARE_PROVIDER_SITE_OTHER): Payer: Medicare Other | Admitting: Family Medicine

## 2015-03-21 VITALS — BP 151/81 | HR 64 | Temp 97.2°F | Ht 58.5 in | Wt 129.0 lb

## 2015-03-21 DIAGNOSIS — M810 Age-related osteoporosis without current pathological fracture: Secondary | ICD-10-CM

## 2015-03-21 DIAGNOSIS — I10 Essential (primary) hypertension: Secondary | ICD-10-CM

## 2015-03-21 DIAGNOSIS — Z1239 Encounter for other screening for malignant neoplasm of breast: Secondary | ICD-10-CM | POA: Diagnosis not present

## 2015-03-21 DIAGNOSIS — E538 Deficiency of other specified B group vitamins: Secondary | ICD-10-CM

## 2015-03-21 DIAGNOSIS — E785 Hyperlipidemia, unspecified: Secondary | ICD-10-CM

## 2015-03-21 DIAGNOSIS — Z5181 Encounter for therapeutic drug level monitoring: Secondary | ICD-10-CM

## 2015-03-21 DIAGNOSIS — F0390 Unspecified dementia without behavioral disturbance: Secondary | ICD-10-CM | POA: Diagnosis not present

## 2015-03-21 DIAGNOSIS — E559 Vitamin D deficiency, unspecified: Secondary | ICD-10-CM | POA: Diagnosis not present

## 2015-03-21 MED ORDER — RIVASTIGMINE TARTRATE 3 MG PO CAPS: 3.0000 mg | ORAL_CAPSULE | Freq: Two times a day (BID) | ORAL | Status: DC

## 2015-03-21 NOTE — Assessment & Plan Note (Signed)
Check vit D and DEXA; fall prevention is key; leg exercises

## 2015-03-21 NOTE — Assessment & Plan Note (Addendum)
Change medicine to a pill; patient appears stable over the last year; she has caregivers

## 2015-03-21 NOTE — Assessment & Plan Note (Signed)
Check level and limit eggs, bacon, sausage

## 2015-03-21 NOTE — Patient Instructions (Addendum)
I'll change the medicine for memory; you'll take the new pill twice a day with food Limit salt We'll contact you about the lab results Do some leg exercises and practice good fall precaution I've ordered bone density and mammogram for you Return for Medicare Wellness visit in the next few months

## 2015-03-21 NOTE — Assessment & Plan Note (Signed)
Check vit D and supplement if needed 

## 2015-03-21 NOTE — Assessment & Plan Note (Signed)
Mammogram ordered

## 2015-03-21 NOTE — Assessment & Plan Note (Signed)
Continue medicine, limit salt 

## 2015-03-21 NOTE — Assessment & Plan Note (Signed)
Check level; not a big meat eater, so we'll make sure enough is being absorbed

## 2015-03-21 NOTE — Progress Notes (Signed)
BP 151/81 mmHg  Pulse 64  Temp(Src) 97.2 F (36.2 C)  Ht 4' 10.5" (1.486 m)  Wt 129 lb (58.514 kg)  BMI 26.50 kg/m2  SpO2 97%   Subjective:    Patient ID: Felicia [name], female    DOB: 1928/04/15, 81 y.o.   MRN: [id number]  HPI: Felicia [name] is a 81 y.o. female  No chief complaint on file. She is "here to get a good report"  No medical excitement over last year; she is here with a caregiver, not family member  She uses a patch for her dementia and caregiver says that the daughter requested it be changed from patch to pill; easier to give her a pill; pharmacy supplies things in a blister pack and they can just punch it through every day and give it to her Memory test: Year: 2014 (wrong) Month: Sept (wrong) Backwards 20 to 1:  Started at 67; missed 11; missed two numbers Months backwards: D, N, O, August, Sept, boy that is hard... Oct, Sept, July, June... I'm lost Recall name and address: could not get name or address  Depression screen [facility] 2/9 03/21/2015  Decreased Interest 0  Down, Depressed, Hopeless 0  PHQ - 2 Score 0   She has had a few falls in the last year; tripping over things or hurrying; they have added a new ramp; she has been helping her husband and his physical health is declining faster than her husband's; coffee table gone; okay to maneuver around the [place] in the kitchen; new bench in the shower; new rails to hold on to at toiler and shower Calcium intake, half a gallon of ice cream a day between the two of them and lots of milk with cereal; not many greens  Relevant past medical, surgical, family and social history reviewed and updated as indicated. Interim medical history since our last visit reviewed. Allergies and medications reviewed and updated. Outpatient Encounter Prescriptions as of 03/21/2015  Medication Sig Note  . atorvastatin (LIPITOR) 10 MG tablet Take 10 mg by mouth at bedtime.   . Cyanocobalamin (VITAMIN B12 PO) Take by mouth daily.   Marland Kitchen  escitalopram (LEXAPRO) 5 MG tablet TAKE 1 TABLET BY MOUTH ONCE DAILY   . losartan (COZAAR) 25 MG tablet TAKE 1 TABLET BY MOUTH ONCE DAILY   . [DISCONTINUED] rivastigmine (EXELON) 4.6 mg/24hr Place 4.6 mg onto the skin daily. 03/21/2015: Patient's daughter states patch is hard to manage and would like to have it changed back to a pill.  . rivastigmine (EXELON) 3 MG capsule Take 1 capsule (3 mg total) by mouth 2 (two) times daily.    No facility-administered encounter medications on file as of 03/21/2015.   Review of Systems Per HPI unless specifically indicated above     Objective:    BP 151/81 mmHg  Pulse 64  Temp(Src) 97.2 F (36.2 C)  Ht 4' 10.5" (1.486 m)  Wt 129 lb (58.514 kg)  BMI 26.50 kg/m2  SpO2 97%  Wt Readings from Last 3 Encounters:  03/21/15 129 lb (58.514 kg)  02/21/14 120 lb (54.432 kg)    Physical Exam  Constitutional: She appears well-developed and well-nourished. No distress.  elderly  HENT:  Head: Normocephalic and atraumatic.  Eyes: EOM are normal. No scleral icterus.  Neck: No thyromegaly present.  Cardiovascular: Normal rate, regular rhythm and normal heart sounds.   No murmur heard. Pulmonary/Chest: Effort normal and breath sounds normal. No respiratory distress. She has no wheezes.  Abdominal: Soft.  Bowel sounds are normal. She exhibits no distension.  Musculoskeletal: Normal range of motion. She exhibits no edema.  Neurological: She is alert. She displays no atrophy and no tremor. No cranial nerve deficit (no gross deficit, no facial asymmetry). She exhibits normal muscle tone. Gait normal.  Memory testing details in HPI, difficulty with short-term memory and recall  Skin: Skin is warm and dry. She is not diaphoretic. No pallor.  Psychiatric: She has a normal mood and affect. Her behavior is normal. Judgment and thought content normal. Her speech is not delayed and not slurred. Cognition and memory are impaired. She exhibits abnormal recent memory.  Very  pleasant, cooperative with examiner, good eye contact      Assessment & Plan:   Problem List Items Addressed This Visit      Cardiovascular and Mediastinum   Hypertension - Primary    Continue medicine, limit salt        Digestive   Vitamin B12 deficiency    Check level; not a big meat eater, so we'll make sure enough is being absorbed      Relevant Orders   Vitamin B12     Nervous and Auditory   Dementia    Change medicine to a pill; patient appears stable over the last year; she has caregivers      Relevant Medications   rivastigmine (EXELON) 3 MG capsule     Musculoskeletal and Integument   Osteoporosis    Check vit D and DEXA; fall prevention is key; leg exercises      Relevant Orders   Vit D  25 hydroxy (rtn osteoporosis monitoring)   DG Bone Density     Other   Hyperlipidemia    Check level and limit eggs, bacon, sausage      Relevant Orders   Lipid Panel w/o Chol/HDL Ratio   Vitamin D deficiency disease    Check vit D and supplement if needed      Relevant Orders   Vit D  25 hydroxy (rtn osteoporosis monitoring)   Medication monitoring encounter   Relevant Orders   Comprehensive metabolic panel   Breast cancer screening    Mammogram ordered      Relevant Orders   MM DIGITAL SCREENING BILATERAL      Follow up plan: Return in about 3 months (around 06/21/2015) for Medicare Wellness.  Meds ordered this encounter  Medications  . Cyanocobalamin (VITAMIN B12 PO)    Sig: Take by mouth daily.  . rivastigmine (EXELON) 3 MG capsule    Sig: Take 1 capsule (3 mg total) by mouth 2 (two) times daily.    Dispense:  60 capsule    Refill:  5    STOP patch and start pill per daughter request   Orders Placed This Encounter  Procedures  . MM DIGITAL SCREENING BILATERAL  . DG Bone Density  . Vitamin B12  . Vit D  25 hydroxy (rtn osteoporosis monitoring)  . Lipid Panel w/o Chol/HDL Ratio  . Comprehensive metabolic panel

## 2015-03-22 LAB — VITAMIN D 25 HYDROXY (VIT D DEFICIENCY, FRACTURES): Vit D, 25-Hydroxy: 31.9 ng/mL (ref 30.0–100.0)

## 2015-03-22 LAB — COMPREHENSIVE METABOLIC PANEL
A/G RATIO: 2.1 (ref 1.1–2.5)
ALK PHOS: 51 IU/L (ref 39–117)
ALT: 8 IU/L (ref 0–32)
AST: 19 IU/L (ref 0–40)
Albumin: 4.2 g/dL (ref 3.5–4.7)
BILIRUBIN TOTAL: 0.6 mg/dL (ref 0.0–1.2)
BUN/Creatinine Ratio: 15 (ref 11–26)
BUN: 13 mg/dL (ref 8–27)
CHLORIDE: 101 mmol/L (ref 97–108)
CO2: 25 mmol/L (ref 18–29)
Calcium: 9.6 mg/dL (ref 8.7–10.3)
Creatinine, Ser: 0.85 mg/dL (ref 0.57–1.00)
GFR calc Af Amer: 72 mL/min/{1.73_m2} (ref >59)
GFR calc non Af Amer: 63 mL/min/{1.73_m2} (ref >59)
GLOBULIN, TOTAL: 2 g/dL (ref 1.5–4.5)
Glucose: 84 mg/dL (ref 65–99)
POTASSIUM: 4.5 mmol/L (ref 3.5–5.2)
SODIUM: 141 mmol/L (ref 134–144)
Total Protein: 6.2 g/dL (ref 6.0–8.5)

## 2015-03-22 LAB — LIPID PANEL W/O CHOL/HDL RATIO
CHOLESTEROL TOTAL: 278 mg/dL — AB (ref 100–199)
HDL: 62 mg/dL (ref >39)
LDL CALC: 197 mg/dL — AB (ref 0–99)
Triglycerides: 94 mg/dL (ref 0–149)
VLDL CHOLESTEROL CAL: 19 mg/dL (ref 5–40)

## 2015-03-22 LAB — VITAMIN B12: VITAMIN B 12: 1973 pg/mL — AB (ref 211–946)

## 2015-03-31 ENCOUNTER — Encounter: Payer: Self-pay | Admitting: Family Medicine

## 2015-04-18 ENCOUNTER — Other Ambulatory Visit: Payer: Self-pay | Admitting: Family Medicine

## 2015-04-30 ENCOUNTER — Encounter: Payer: Self-pay | Admitting: Family Medicine

## 2015-05-22 ENCOUNTER — Encounter: Payer: Self-pay | Admitting: Family Medicine

## 2015-05-26 ENCOUNTER — Telehealth: Payer: Self-pay

## 2015-05-26 NOTE — Telephone Encounter (Signed)
-----   Message from Kerman PasseyMelinda P Lada, MD sent at 05/23/2015  4:41 PM EDT ----- Regarding: Overdue DEXA   ----- Message -----    From: SYSTEM    Sent: 03/22/2015  12:05 AM      To: Kerman PasseyMelinda P Lada, MD

## 2015-05-26 NOTE — Telephone Encounter (Signed)
Patient's care taker reminded that she needs a Mammogram and DEXA. She will make sure it gets scheduled.

## 2015-07-07 ENCOUNTER — Telehealth: Payer: Self-pay

## 2015-07-07 NOTE — Telephone Encounter (Signed)
I've already spoke with patient's care giver about getting these scheduled. I'm now sending a reminder letter.

## 2015-07-07 NOTE — Telephone Encounter (Signed)
-----   Message from Kerman PasseyMelinda P Lada, MD sent at 06/21/2015  5:43 PM EST ----- Regarding: Call daughter or son about overdue mammo and DEXA There are old orders for DEXA and mammo We recommend she gets these done However, if they wish to refuse, please document they reason(s) before sending back to me to cancel

## 2015-07-25 ENCOUNTER — Other Ambulatory Visit: Payer: Self-pay | Admitting: Family Medicine

## 2015-07-25 NOTE — Telephone Encounter (Signed)
August K+ and Cr normal; Rx approved

## 2015-09-18 ENCOUNTER — Emergency Department: Payer: Medicare Other

## 2015-09-18 ENCOUNTER — Inpatient Hospital Stay
Admission: EM | Admit: 2015-09-18 | Discharge: 2015-09-21 | DRG: 392 | Disposition: A | Discharge status: Skilled Nursing Facility | Payer: Medicare Other | Attending: Internal Medicine | Admitting: Internal Medicine

## 2015-09-18 ENCOUNTER — Encounter: Payer: Self-pay | Admitting: Emergency Medicine

## 2015-09-18 ENCOUNTER — Inpatient Hospital Stay: Payer: Medicare Other

## 2015-09-18 DIAGNOSIS — R339 Retention of urine, unspecified: Secondary | ICD-10-CM | POA: Diagnosis present

## 2015-09-18 DIAGNOSIS — Z23 Encounter for immunization: Secondary | ICD-10-CM | POA: Diagnosis not present

## 2015-09-18 DIAGNOSIS — A084 Viral intestinal infection, unspecified: Secondary | ICD-10-CM | POA: Diagnosis present

## 2015-09-18 DIAGNOSIS — D649 Anemia, unspecified: Secondary | ICD-10-CM | POA: Diagnosis not present

## 2015-09-18 DIAGNOSIS — Z79899 Other long term (current) drug therapy: Secondary | ICD-10-CM | POA: Diagnosis not present

## 2015-09-18 DIAGNOSIS — M810 Age-related osteoporosis without current pathological fracture: Secondary | ICD-10-CM | POA: Diagnosis present

## 2015-09-18 DIAGNOSIS — E876 Hypokalemia: Secondary | ICD-10-CM | POA: Diagnosis not present

## 2015-09-18 DIAGNOSIS — R531 Weakness: Secondary | ICD-10-CM

## 2015-09-18 DIAGNOSIS — Z8249 Family history of ischemic heart disease and other diseases of the circulatory system: Secondary | ICD-10-CM

## 2015-09-18 DIAGNOSIS — R410 Disorientation, unspecified: Secondary | ICD-10-CM

## 2015-09-18 DIAGNOSIS — I959 Hypotension, unspecified: Secondary | ICD-10-CM

## 2015-09-18 DIAGNOSIS — I10 Essential (primary) hypertension: Secondary | ICD-10-CM | POA: Diagnosis present

## 2015-09-18 DIAGNOSIS — R109 Unspecified abdominal pain: Secondary | ICD-10-CM | POA: Diagnosis present

## 2015-09-18 DIAGNOSIS — E785 Hyperlipidemia, unspecified: Secondary | ICD-10-CM | POA: Diagnosis not present

## 2015-09-18 DIAGNOSIS — D696 Thrombocytopenia, unspecified: Secondary | ICD-10-CM | POA: Diagnosis not present

## 2015-09-18 DIAGNOSIS — F0390 Unspecified dementia without behavioral disturbance: Secondary | ICD-10-CM | POA: Diagnosis present

## 2015-09-18 DIAGNOSIS — Z8673 Personal history of transient ischemic attack (TIA), and cerebral infarction without residual deficits: Secondary | ICD-10-CM | POA: Diagnosis not present

## 2015-09-18 DIAGNOSIS — R509 Fever, unspecified: Secondary | ICD-10-CM

## 2015-09-18 DIAGNOSIS — E860 Dehydration: Secondary | ICD-10-CM | POA: Diagnosis present

## 2015-09-18 DIAGNOSIS — R197 Diarrhea, unspecified: Secondary | ICD-10-CM

## 2015-09-18 LAB — CBC WITH DIFFERENTIAL/PLATELET
Basophils Absolute: 0 10*3/uL (ref 0–0.1)
Basophils Absolute: 0 10*3/uL (ref 0–0.1)
Basophils Relative: 0 %
Basophils Relative: 0 %
Eosinophils Absolute: 0 10*3/uL (ref 0–0.7)
Eosinophils Absolute: 0 10*3/uL (ref 0–0.7)
Eosinophils Relative: 0 %
Eosinophils Relative: 0 %
HCT: 35.8 % (ref 35.0–47.0)
HEMATOCRIT: 31.5 % — AB (ref 35.0–47.0)
HEMOGLOBIN: 10.8 g/dL — AB (ref 12.0–16.0)
Hemoglobin: 12.3 g/dL (ref 12.0–16.0)
LYMPHS ABS: 0.3 10*3/uL — AB (ref 1.0–3.6)
LYMPHS PCT: 4 %
Lymphocytes Relative: 2 %
Lymphs Abs: 0.2 10*3/uL — ABNORMAL LOW (ref 1.0–3.6)
MCH: 30.6 pg (ref 26.0–34.0)
MCH: 30.9 pg (ref 26.0–34.0)
MCHC: 34.3 g/dL (ref 32.0–36.0)
MCHC: 34.1 g/dL (ref 32.0–36.0)
MCV: 89.3 fL (ref 80.0–100.0)
MCV: 90.6 fL (ref 80.0–100.0)
MONOS PCT: 9 %
Monocytes Absolute: 0.8 10*3/uL (ref 0.2–0.9)
Monocytes Absolute: 0.6 10*3/uL (ref 0.2–0.9)
Monocytes Relative: 9 %
NEUTROS PCT: 87 %
Neutro Abs: 7.3 10*3/uL — ABNORMAL HIGH (ref 1.4–6.5)
Neutro Abs: 5.4 10*3/uL (ref 1.4–6.5)
Neutrophils Relative %: 89 %
Platelets: 171 10*3/uL (ref 150–440)
Platelets: 153 10*3/uL (ref 150–440)
RBC: 4 MIL/uL (ref 3.80–5.20)
RBC: 3.48 MIL/uL — AB (ref 3.80–5.20)
RDW: 13.4 % (ref 11.5–14.5)
RDW: 13.1 % (ref 11.5–14.5)
WBC: 8.3 10*3/uL (ref 3.6–11.0)
WBC: 6.3 10*3/uL (ref 3.6–11.0)

## 2015-09-18 LAB — URINALYSIS COMPLETE WITH MICROSCOPIC (ARMC ONLY)
Bacteria, UA: NONE SEEN
Bilirubin Urine: NEGATIVE
Glucose, UA: NEGATIVE mg/dL
Hgb urine dipstick: NEGATIVE
Leukocytes, UA: NEGATIVE
Nitrite: NEGATIVE
Protein, ur: NEGATIVE mg/dL
Specific Gravity, Urine: 1.023 (ref 1.005–1.030)
Squamous Epithelial / HPF: NONE SEEN
pH: 6 (ref 5.0–8.0)

## 2015-09-18 LAB — RAPID INFLUENZA A&B ANTIGENS
Influenza A (ARMC): NEGATIVE
Influenza B (ARMC): NEGATIVE

## 2015-09-18 LAB — COMPREHENSIVE METABOLIC PANEL
ALK PHOS: 38 U/L (ref 38–126)
ALT: 21 U/L (ref 14–54)
ANION GAP: 6 (ref 5–15)
AST: 32 U/L (ref 15–41)
Albumin: 3.8 g/dL (ref 3.5–5.0)
BUN: 18 mg/dL (ref 6–20)
CALCIUM: 9 mg/dL (ref 8.9–10.3)
CO2: 28 mmol/L (ref 22–32)
Chloride: 105 mmol/L (ref 101–111)
Creatinine, Ser: 0.52 mg/dL (ref 0.44–1.00)
GFR calc non Af Amer: >60 mL/min (ref >60)
Glucose, Bld: 103 mg/dL — ABNORMAL HIGH (ref 65–99)
Potassium: 3.6 mmol/L (ref 3.5–5.1)
SODIUM: 139 mmol/L (ref 135–145)
Total Bilirubin: 0.5 mg/dL (ref 0.3–1.2)
Total Protein: 6.1 g/dL — ABNORMAL LOW (ref 6.5–8.1)

## 2015-09-18 LAB — LACTIC ACID, PLASMA: Lactic Acid, Venous: 1 mmol/L (ref 0.5–2.0)

## 2015-09-18 LAB — TROPONIN I: Troponin I: <0.03 ng/mL (ref <0.031)

## 2015-09-18 LAB — LIPASE, BLOOD: Lipase: 25 U/L (ref 11–51)

## 2015-09-18 MED ORDER — VANCOMYCIN HCL IN DEXTROSE 1-5 GM/200ML-% IV SOLN
1000.0000 mg | INTRAVENOUS | Status: AC
  Administered 2015-09-18: 1000 mg via INTRAVENOUS

## 2015-09-18 MED ORDER — IOHEXOL 240 MG/ML SOLN
25.0000 mL | Freq: Once | INTRAMUSCULAR | Status: AC | PRN
  Administered 2015-09-18: 25 mL via ORAL

## 2015-09-18 MED ORDER — VANCOMYCIN HCL IN DEXTROSE 1-5 GM/200ML-% IV SOLN
1000.0000 mg | Freq: Once | INTRAVENOUS | Status: DC
  Filled 2015-09-18: qty 200

## 2015-09-18 MED ORDER — RIVASTIGMINE TARTRATE 3 MG PO CAPS
3.0000 mg | ORAL_CAPSULE | Freq: Two times a day (BID) | ORAL | Status: DC
  Administered 2015-09-18 – 2015-09-21 (×5): 3 mg via ORAL
  Filled 2015-09-18 (×8): qty 1

## 2015-09-18 MED ORDER — ATORVASTATIN CALCIUM 10 MG PO TABS
10.0000 mg | ORAL_TABLET | Freq: Every day | ORAL | Status: DC
  Administered 2015-09-18 – 2015-09-20 (×3): 10 mg via ORAL
  Filled 2015-09-18 (×3): qty 1

## 2015-09-18 MED ORDER — HEPARIN SODIUM (PORCINE) 5000 UNIT/ML IJ SOLN
5000.0000 [IU] | Freq: Three times a day (TID) | INTRAMUSCULAR | Status: DC
  Administered 2015-09-18 – 2015-09-21 (×9): 5000 [IU] via SUBCUTANEOUS
  Filled 2015-09-18 (×9): qty 1

## 2015-09-18 MED ORDER — SODIUM CHLORIDE 0.9 % IV BOLUS (SEPSIS)
1000.0000 mL | INTRAVENOUS | Status: AC
  Administered 2015-09-18 (×2): 1000 mL via INTRAVENOUS

## 2015-09-18 MED ORDER — SODIUM CHLORIDE 0.9 % IV SOLN
1000.0000 mL | Freq: Once | INTRAVENOUS | Status: AC
  Administered 2015-09-18: 1000 mL via INTRAVENOUS

## 2015-09-18 MED ORDER — SODIUM CHLORIDE 0.9 % IV SOLN
INTRAVENOUS | Status: DC
  Administered 2015-09-18 – 2015-09-19 (×2): via INTRAVENOUS

## 2015-09-18 MED ORDER — LOPERAMIDE HCL 2 MG PO CAPS: 2.0000 mg | ORAL_CAPSULE | ORAL | Status: DC | PRN

## 2015-09-18 MED ORDER — ESCITALOPRAM OXALATE 10 MG PO TABS
5.0000 mg | ORAL_TABLET | Freq: Every day | ORAL | Status: DC
  Administered 2015-09-18 – 2015-09-21 (×4): 5 mg via ORAL
  Filled 2015-09-18 (×4): qty 1

## 2015-09-18 MED ORDER — IOHEXOL 300 MG/ML  SOLN
100.0000 mL | Freq: Once | INTRAMUSCULAR | Status: AC | PRN
  Administered 2015-09-18: 100 mL via INTRAVENOUS

## 2015-09-18 MED ORDER — ACETAMINOPHEN 325 MG PO TABS
650.0000 mg | ORAL_TABLET | Freq: Once | ORAL | Status: AC
Start: 2015-09-18 — End: 2015-09-18
  Administered 2015-09-18: 650 mg via ORAL
  Filled 2015-09-18: qty 2

## 2015-09-18 MED ORDER — VITAMIN B-12 1000 MCG PO TABS
1000.0000 ug | ORAL_TABLET | Freq: Every day | ORAL | Status: DC
  Administered 2015-09-18 – 2015-09-21 (×4): 1000 ug via ORAL
  Filled 2015-09-18 (×4): qty 1

## 2015-09-18 MED ORDER — ONDANSETRON 4 MG PO TBDP: 4.0000 mg | ORAL_TABLET | Freq: Three times a day (TID) | ORAL | Status: DC | PRN

## 2015-09-18 MED ORDER — PIPERACILLIN-TAZOBACTAM 3.375 G IVPB
3.3750 g | Freq: Three times a day (TID) | INTRAVENOUS | Status: DC
  Administered 2015-09-19 (×2): 3.375 g via INTRAVENOUS
  Filled 2015-09-18 (×4): qty 50

## 2015-09-18 MED ORDER — LOPERAMIDE HCL 2 MG PO CAPS
4.0000 mg | ORAL_CAPSULE | Freq: Once | ORAL | Status: AC
  Administered 2015-09-18: 4 mg via ORAL
  Filled 2015-09-18: qty 2

## 2015-09-18 MED ORDER — VANCOMYCIN HCL IN DEXTROSE 1-5 GM/200ML-% IV SOLN
1000.0000 mg | INTRAVENOUS | Status: DC
  Administered 2015-09-19: 1000 mg via INTRAVENOUS
  Filled 2015-09-18: qty 200

## 2015-09-18 MED ORDER — ONDANSETRON HCL 4 MG/2ML IJ SOLN
4.0000 mg | Freq: Once | INTRAMUSCULAR | Status: AC
  Administered 2015-09-18: 4 mg via INTRAVENOUS
  Filled 2015-09-18: qty 2

## 2015-09-18 MED ORDER — PIPERACILLIN-TAZOBACTAM 3.375 G IVPB 30 MIN
3.3750 g | Freq: Once | INTRAVENOUS | Status: AC
  Administered 2015-09-18: 3.375 g via INTRAVENOUS
  Filled 2015-09-18: qty 50

## 2015-09-18 NOTE — ED Notes (Signed)
Contact Daughter Caera Enwright at [phone] for question or update.

## 2015-09-18 NOTE — H&P (Signed)
[facility] [place] at [facility]   PATIENT NAME: Felicia [name]    MR#:  [id number]  DATE OF BIRTH:  24-Nov-1927  DATE OF ADMISSION:  09/18/2015  PRIMARY CARE PHYSICIAN: Baruch Gouty, MD   REQUESTING/REFERRING PHYSICIAN: Schaevitz  CHIEF COMPLAINT:   Chief Complaint  Patient presents with  . Weakness  . Diarrhea    HISTORY OF PRESENT ILLNESS: Felicia [name]  is a 82 y.o. female with a known history of hyperlipidemia, hypertension, urinary retention, thalamic infarction- called EMS today by herself as she is not feeling good. Patient is currently slightly confused and not giving me any further history, but says that she came here because she was not feeling good. She denies any other complaints. But as per ER physician on arrival she had complain of diarrhea, though there is no any bowel movement since she is in the emergency room. Her white cell count is normal, blood pressure was stable on arrival, so ER physician decided to discharge her home but while in the ER, she had episode of fever and blood pressure gradually started going down so he gave as admission to hospitalist team. Her chest x-ray is negative and influenza test is negative. She was given IV vancomycin and Zosyn 1 dose by ER.  PAST MEDICAL HISTORY:   Past Medical History  Diagnosis Date  . Rosacea   . Hyperlipidemia   . Hypertension   . Osteoporosis   . Dementia   . Vitamin D deficiency disease   . Urinary retention     chronic  . Condyloma acuminatum   . Thalamic infarction (HCC)   . Post-menopausal     PAST SURGICAL HISTORY: Past Surgical History  Procedure Laterality Date  . Suprapubic catheter placement  Nov. 2014    SOCIAL HISTORY:  Social History  Substance Use Topics  . Smoking status: Never Smoker   . Smokeless tobacco: Never Used  . Alcohol Use: No    FAMILY HISTORY:  Family History  Problem Relation Age of Onset  . CAD Father     DRUG ALLERGIES: No Known  Allergies  REVIEW OF SYSTEMS:   Not able to get any further details from patient possibly baseline dementia and slight confusion currently because of some acute infection.  MEDICATIONS AT HOME:  Prior to Admission medications   Medication Sig Start Date End Date Taking? Authorizing Provider  atorvastatin (LIPITOR) 10 MG tablet Take 10 mg by mouth at bedtime.    Historical Provider, MD  Cyanocobalamin (VITAMIN B12 PO) Take by mouth daily.    Historical Provider, MD  escitalopram (LEXAPRO) 5 MG tablet TAKE 1 TABLET BY MOUTH ONCE DAILY 07/25/15   Kerman Passey, MD  loperamide (IMODIUM) 2 MG capsule Take 1 capsule (2 mg total) by mouth as needed for diarrhea or loose stools. 09/18/15   Emily Filbert, MD  losartan (COZAAR) 25 MG tablet TAKE 1 TABLET BY MOUTH ONCE DAILY 07/25/15   Kerman Passey, MD  ondansetron (ZOFRAN ODT) 4 MG disintegrating tablet Take 1 tablet (4 mg total) by mouth every 8 (eight) hours as needed for nausea or vomiting. 09/18/15   Emily Filbert, MD  rivastigmine (EXELON) 3 MG capsule Take 1 capsule (3 mg total) by mouth 2 (two) times daily. 03/21/15   Kerman Passey, MD      PHYSICAL EXAMINATION:   VITAL SIGNS: Blood pressure 95/56, pulse 87, temperature 102.6 F (39.2 C), temperature source Oral, resp. rate 19, height 5\' 2"  (1.575  m), weight 56.7 kg (125 lb), SpO2 94 %.  GENERAL:  82 y.o.-year-old patient lying in the bed with no acute distress.  EYES: Pupils equal, round, reactive to light and accommodation. No scleral icterus. Extraocular muscles intact.  HEENT: Head atraumatic, normocephalic. Oropharynx and nasopharynx clear. Oral mucosa dry. NECK:  Supple, no jugular venous distention. No thyroid enlargement, no tenderness.  LUNGS: Normal breath sounds bilaterally, no wheezing, rales,rhonchi or crepitation. No use of accessory muscles of respiration.  CARDIOVASCULAR: S1, S2 normal. No murmurs, rubs, or gallops.  ABDOMEN: Soft, nontender, nondistended.  Bowel sounds present. No organomegaly or mass.  EXTREMITIES: No pedal edema, cyanosis, or clubbing.  NEUROLOGIC: Cranial nerves II through XII are intact. Muscle strength 4/5 in all extremities. Sensation intact. Gait not checked.  PSYCHIATRIC: The patient is alert and some confused.  SKIN: No obvious rash, lesion, or ulcer.   LABORATORY PANEL:   CBC  Recent Labs Lab 09/18/15 1346  WBC 8.3  HGB 12.3  HCT 35.8  PLT 171  MCV 89.3  MCH 30.6  MCHC 34.3  RDW 13.4  LYMPHSABS 0.2*  MONOABS 0.8  EOSABS 0.0  BASOSABS 0.0   ------------------------------------------------------------------------------------------------------------------  Chemistries   Recent Labs Lab 09/18/15 1346  NA 139  K 3.6  CL 105  CO2 28  GLUCOSE 103*  BUN 18  CREATININE 0.52  CALCIUM 9.0  AST 32  ALT 21  ALKPHOS 38  BILITOT 0.5   ------------------------------------------------------------------------------------------------------------------ estimated creatinine clearance is 39.9 mL/min (by C-G formula based on Cr of 0.52). ------------------------------------------------------------------------------------------------------------------ No results for input(s): TSH, T4TOTAL, T3FREE, THYROIDAB in the last 72 hours.  Invalid input(s): FREET3   Coagulation profile No results for input(s): INR, PROTIME in the last 168 hours. ------------------------------------------------------------------------------------------------------------------- No results for input(s): DDIMER in the last 72 hours. -------------------------------------------------------------------------------------------------------------------  Cardiac Enzymes  Recent Labs Lab 09/18/15 1346  TROPONINI <0.03   ------------------------------------------------------------------------------------------------------------------ Invalid input(s):  POCBNP  ---------------------------------------------------------------------------------------------------------------  Urinalysis    Component Value Date/Time   COLORURINE YELLOW* 09/18/2015 1346   COLORURINE Yellow 02/27/2014 1701   APPEARANCEUR CLEAR* 09/18/2015 1346   APPEARANCEUR Clear 02/27/2014 1701   LABSPEC 1.023 09/18/2015 1346   LABSPEC 1.024 02/27/2014 1701   PHURINE 6.0 09/18/2015 1346   PHURINE 5.0 02/27/2014 1701   GLUCOSEU NEGATIVE 09/18/2015 1346   GLUCOSEU Negative 02/27/2014 1701   HGBUR NEGATIVE 09/18/2015 1346   HGBUR Negative 02/27/2014 1701   BILIRUBINUR NEGATIVE 09/18/2015 1346   BILIRUBINUR Negative 02/27/2014 1701   KETONESUR TRACE* 09/18/2015 1346   KETONESUR Trace 02/27/2014 1701   PROTEINUR NEGATIVE 09/18/2015 1346   PROTEINUR Negative 02/27/2014 1701   NITRITE NEGATIVE 09/18/2015 1346   NITRITE Negative 02/27/2014 1701   LEUKOCYTESUR NEGATIVE 09/18/2015 1346   LEUKOCYTESUR Trace 02/27/2014 1701     RADIOLOGY: Dg Chest 2 View  09/18/2015  CLINICAL DATA:  Weakness and diarrhea EXAM: CHEST  2 VIEW COMPARISON:  04/30/2013 FINDINGS: Cardiac shadow is within normal limits. Aortic calcifications are again seen. Lungs are clear bilaterally. No sizable effusion or infiltrate is noted. No acute bony abnormality is noted. IMPRESSION: No active cardiopulmonary disease. Electronically Signed   By: Alcide Clever M.D.   On: 09/18/2015 18:50    EKG: Orders placed or performed during the hospital encounter of 09/18/15  . ED EKG  . ED EKG    IMPRESSION AND PLAN:  * Fever of unknown origin  Most likely viral infection  Blood cultures and urine cultures are sent by ER.  Patient appears slightly dehydrated, chest x-ray is  clear on admission.  I will hydrate her for now and repeat chest x-ray tomorrow as she had some cough.  ER give one dose of IV Vanco and Zosyn.  Her influenza test is negative.  I will not give any antibiotic as there is no clear source  of infection and we can watch for any further clinical evidence.  There is no skin lesions.  * Hypertension  We'll hold her medication because- blood pressure is running in 90s.  * Confusion  The right base underlying dementia also.  And she is running some viral infection with fever.  We will continue watching.  * Possible diarrhea  As per ER physician she complained of diarrhea one time while she came to ER.  She did not had any episode over here.  I would suggest to check stool for further evidence of infection if she has any more diarrhea.   All the records are reviewed and case discussed with ED provider. Management plans discussed with the patient, family and they are in agreement.  CODE STATUS: Full Code Status History    This patient does not have a recorded code status. Please follow your organizational policy for patients in this situation.       TOTAL TIME TAKING CARE OF THIS PATIENT: 50 minutes.    Altamese Dilling M.D on 09/18/2015   Between 7am to 6pm - Pager - [phone]  After 6pm go to [email or web address] - password [email or web address]  [facility]  Office  [phone]  CC: Primary care physician; Baruch Gouty, MD   Note: This dictation was prepared with Dragon dictation along with smaller phrase technology. Any transcriptional errors that result from this process are unintentional.

## 2015-09-18 NOTE — Progress Notes (Signed)
Pharmacy Antibiotic Note  Felicia [name] is a 82 y.o. female admitted on 09/18/2015 with sepsis.  Pharmacy has been consulted for Vancomycin and Zosyn  dosing.  Plan:  Pk parameters: Kel (hr-1): 0.038 Half-life (hrs): 18.24 Vd (liters): 39.69 (factor used: 0.7 L/kg)   Will give Vancomycin 1 g IV x 1 then will start Vancomycin 1g IV q24 hours ~14 hours post dose. Will orderVancomycin trough level prior to the 0900 dose on 2/20.   Height:  (157.5 cm) Weight: 125 lb (56.7 kg) IBW/kg (Calculated) : 50.1  Temp (24hrs), Avg:100.7 F (38.2 C), Min:98 F (36.7 C), Max:102.6 F (39.2 C)   Recent Labs Lab 09/18/15 1346  WBC 8.3  CREATININE 0.52    Estimated Creatinine Clearance: 39.9 mL/min (by C-G formula based on Cr of 0.52).    No Known Allergies  Antimicrobials this admission: Vancomycin  2/16 >>  Zosyn  2/16 >>   Dose adjustments this admission:   Microbiology results:  BCx:   UCx:    Sputum:    MRSA PCR:   Thank you for allowing pharmacy to be a part of this patient's care.  Mylene Bow D 09/18/2015 7:12 PM

## 2015-09-18 NOTE — Discharge Instructions (Signed)
Diarrhea Diarrhea is frequent loose and watery bowel movements. It can cause you to feel weak and dehydrated. Dehydration can cause you to become tired and thirsty, have a dry mouth, and have decreased urination that often is dark yellow. Diarrhea is a sign of another problem, most often an infection that will not last long. In most cases, diarrhea typically lasts 2-3 days. However, it can last longer if it is a sign of something more serious. It is important to treat your diarrhea as directed by your caregiver to lessen or prevent future episodes of diarrhea. CAUSES  Some common causes include:  Gastrointestinal infections caused by viruses, bacteria, or parasites.  Food poisoning or food allergies.  Certain medicines, such as antibiotics, chemotherapy, and laxatives.  Artificial sweeteners and fructose.  Digestive disorders. HOME CARE INSTRUCTIONS  Ensure adequate fluid intake (hydration): Have 1 cup (8 oz) of fluid for each diarrhea episode. Avoid fluids that contain simple sugars or sports drinks, fruit juices, whole milk products, and sodas. Your urine should be clear or pale yellow if you are drinking enough fluids. Hydrate with an oral rehydration solution that you can purchase at pharmacies, retail stores, and online. You can prepare an oral rehydration solution at home by mixing the following ingredients together:   - tsp table salt.   tsp baking soda.   tsp salt substitute containing potassium chloride.  1  tablespoons sugar.  1 L (34 oz) of water.  Certain foods and beverages may increase the speed at which food moves through the gastrointestinal (GI) tract. These foods and beverages should be avoided and include:  Caffeinated and alcoholic beverages.  High-fiber foods, such as raw fruits and vegetables, nuts, seeds, and whole grain breads and cereals.  Foods and beverages sweetened with sugar alcohols, such as xylitol, sorbitol, and mannitol.  Some foods may be well  tolerated and may help thicken stool including:  Starchy foods, such as rice, toast, pasta, low-sugar cereal, oatmeal, grits, baked potatoes, crackers, and bagels.  Bananas.  Applesauce.  Add probiotic-rich foods to help increase healthy bacteria in the GI tract, such as yogurt and fermented milk products.  Wash your hands well after each diarrhea episode.  Only take over-the-counter or prescription medicines as directed by your caregiver.  Take a warm bath to relieve any burning or pain from frequent diarrhea episodes. SEEK IMMEDIATE MEDICAL CARE IF:   You are unable to keep fluids down.  You have persistent vomiting.  You have blood in your stool, or your stools are black and tarry.  You do not urinate in 6-8 hours, or there is only a small amount of very dark urine.  You have abdominal pain that increases or localizes.  You have weakness, dizziness, confusion, or light-headedness.  You have a severe headache.  Your diarrhea gets worse or does not get better.  You have a fever or persistent symptoms for more than 2-3 days.  You have a fever and your symptoms suddenly get worse. MAKE SURE YOU:   Understand these instructions.  Will watch your condition.  Will get help right away if you are not doing well or get worse.   This information is not intended to replace advice given to you by your health care provider. Make sure you discuss any questions you have with your health care provider.   Document Released: 07/09/2002 Document Revised: 08/09/2014 Document Reviewed: 03/26/2012 Elsevier Interactive Patient Education 2016 [employer]  Weakness Weakness is a lack of strength. It may be felt  all over the body (generalized) or in one specific part of the body (focal). Some causes of weakness can be serious. You may need further medical evaluation, especially if you are elderly or you have a history of immunosuppression (such as chemotherapy or HIV), kidney disease,  heart disease, or diabetes. CAUSES  Weakness can be caused by many different things, including:  Infection.  Physical exhaustion.  Internal bleeding or other blood loss that results in a lack of red blood cells (anemia).  Dehydration. This cause is more common in elderly people.  Side effects or electrolyte abnormalities from medicines, such as pain medicines or sedatives.  Emotional distress, anxiety, or depression.  Circulation problems, especially severe peripheral arterial disease.  Heart disease, such as rapid atrial fibrillation, bradycardia, or heart failure.  Nervous system disorders, such as Guillain-Barr syndrome, multiple sclerosis, or stroke. DIAGNOSIS  To find the cause of your weakness, your caregiver will take your history and perform a physical exam. Lab tests or X-rays may also be ordered, if needed. TREATMENT  Treatment of weakness depends on the cause of your symptoms and can vary greatly. HOME CARE INSTRUCTIONS   Rest as needed.  Eat a well-balanced diet.  Try to get some exercise every day.  Only take over-the-counter or prescription medicines as directed by your caregiver. SEEK MEDICAL CARE IF:   Your weakness seems to be getting worse or spreads to other parts of your body.  You develop new aches or pains. SEEK IMMEDIATE MEDICAL CARE IF:   You cannot perform your normal daily activities, such as getting dressed and feeding yourself.  You cannot walk up and down stairs, or you feel exhausted when you do so.  You have shortness of breath or chest pain.  You have difficulty moving parts of your body.  You have weakness in only one area of the body or on only one side of the body.  You have a fever.  You have trouble speaking or swallowing.  You cannot control your bladder or bowel movements.  You have black or bloody vomit or stools. MAKE SURE YOU:  Understand these instructions.  Will watch your condition.  Will get help right away  if you are not doing well or get worse.   This information is not intended to replace advice given to you by your health care provider. Make sure you discuss any questions you have with your health care provider.   Document Released: 07/19/2005 Document Revised: 01/18/2012 Document Reviewed: 09/17/2011 Elsevier Interactive Patient Education [employer]

## 2015-09-18 NOTE — ED Provider Notes (Signed)
[facility] Emergency Department Provider Note     Time seen: ----------------------------------------- 1:29 PM on 09/18/2015 -----------------------------------------    I have reviewed the triage vital signs and the nursing notes.   HISTORY  Chief Complaint No chief complaint on file.    HPI Felicia [name] is a 82 y.o. female who presents to ER for feeling weak. She said significant diarrhea over the last 48 hours, has had poor by mouth intake. Nothing makes her symptoms better, states her husband is also admitted for similar GI illness. She denies fevers chills or other complaints. She denies nausea at this time or abdominal pain unless you press on her abdomen.   Past Medical History  Diagnosis Date  . Rosacea   . Hyperlipidemia   . Hypertension   . Osteoporosis   . Dementia   . Vitamin D deficiency disease   . Urinary retention     chronic  . Condyloma acuminatum   . Thalamic infarction   . Post-menopausal     Patient Active Problem List   Diagnosis Date Noted  . Medication monitoring encounter 03/21/2015  . Vitamin B12 deficiency 03/21/2015  . Breast cancer screening 03/21/2015  . Rosacea   . Hyperlipidemia   . Hypertension   . Osteoporosis   . Dementia   . Vitamin D deficiency disease   . Urinary retention   . Condyloma acuminatum   . Thalamic infarction [facility])     Past Surgical History  Procedure Laterality Date  . Suprapubic catheter placement  Nov. 2014    Allergies Review of patient's allergies indicates no known allergies.  Social History Social History  Substance Use Topics  . Smoking status: Never Smoker   . Smokeless tobacco: Never Used  . Alcohol Use: No    Review of Systems Constitutional: Negative for fever. Eyes: Negative for visual changes. ENT: Negative for sore throat. Cardiovascular: Negative for chest pain. Respiratory: Negative for shortness of breath. Gastrointestinal: Negative for abdominal  pain, positive for diarrhea Genitourinary: Negative for dysuria. Musculoskeletal: Negative for back pain. Skin: Negative for rash. Neurological: Negative for headaches, focal weakness or numbness.  10-point ROS otherwise negative.  ____________________________________________   PHYSICAL EXAM:  VITAL SIGNS: ED Triage Vitals  Enc Vitals Group     BP --      Pulse --      Resp --      Temp --      Temp src --      SpO2 --      Weight --      Height --      Head Cir --      Peak Flow --      Pain Score --      Pain Loc --      Pain Edu? --      Excl. in GC? --     Constitutional: Alert and in no distress. Eyes: Conjunctivae are normal. PERRL. Normal extraocular movements. ENT   Head: Normocephalic and atraumatic.   Nose: No congestion/rhinnorhea.   Mouth/Throat: Mucous membranes are moist.   Neck: No stridor. Cardiovascular: Normal rate, regular rhythm. Normal and symmetric distal pulses are present in all extremities. No murmurs, rubs, or gallops. Respiratory: Normal respiratory effort without tachypnea nor retractions. Breath sounds are clear and equal bilaterally. No wheezes/rales/rhonchi. Gastrointestinal: Soft and nontender. No distention. No abdominal bruits.  Musculoskeletal: Nontender with normal range of motion in all extremities. No lower extremity tenderness nor edema. Neurologic:  Normal speech  and language. No gross focal neurologic deficits are appreciated.  Skin:  Skin is warm, dry and intact. Pallor is noted Psychiatric: Mood and affect are normal.  ____________________________________________  EKG: Interpreted by me. Normal sinus rhythm with rate 86 bpm, normal PR interval, normal QRS, normal QT interval. Normal axis.  ____________________________________________  ED COURSE:  Pertinent labs & imaging results that were available during my care of the patient were reviewed by me and considered in my medical decision making (see chart for  details). Patient is no acute distress, likely dehydrated from GI illness and poor by mouth intake. She will receive IV fluids and we will check basic labs. ____________________________________________    LABS (pertinent positives/negatives)  Labs Reviewed  CBC WITH DIFFERENTIAL/PLATELET - Abnormal; Notable for the following:    Neutro Abs 7.3 (*)    Lymphs Abs 0.2 (*)    All other components within normal limits  COMPREHENSIVE METABOLIC PANEL - Abnormal; Notable for the following:    Glucose, Bld 103 (*)    Total Protein 6.1 (*)    All other components within normal limits  TROPONIN I  LIPASE, BLOOD  URINALYSIS COMPLETEWITH MICROSCOPIC ([facility] ONLY)  ____________________________________________  FINAL ASSESSMENT AND PLAN  Weakness, dehydration, diarrhea  Plan: Patient with labs as dictated above. Patient was given a saline bolus as well as IV Zofran. Her labs looked reassuring and she is stable for outpatient follow-up with her doctor.   Emily Filbert, MD   Emily Filbert, MD 09/18/15 [phone]

## 2015-09-18 NOTE — ED Notes (Signed)
Pt given sandwich tray and juice  

## 2015-09-18 NOTE — ED Notes (Signed)
Report attempt x

## 2015-09-18 NOTE — Progress Notes (Signed)
Spoke to her daughterHabiba Treloar- [phone]  Pt had abdominal cramps- so will get CT abd.  Pt's husband is also admitted here 2 days ago for similar symptoms, he is in hospital now- Mr lydiana, milley ( [phone])

## 2015-09-18 NOTE — ED Provider Notes (Signed)
Sign out from Dr. Mayford Knife in this patient with diarrhea. Plan is to follow-up with her urinalysis. Urinalysis resulted and does not show overt signs of infection. Patient's temperature rose to 101.6 during her stay. She was given Tylenol but the temperature continued to rise and the subsequent measurement was 102.6. Patient without any new complaints.  Physical Exam  BP 98/64 mmHg  Pulse 93  Temp(Src) 102.6 F (39.2 C) (Oral)  Resp 20  Ht  (1.575 m)  Wt 125 lb (56.7 kg)  BMI 22.86 kg/m2  SpO2 94% ----------------------------------------- 7:00 PM on 09/18/2015 -----------------------------------------   Physical Exam Resting comfortable at this time without any new complaints. ED Course  Procedures  MDM Temperature rising and blood pressure lowering now in the 90s. Sepsis were called. Will cover with broad-spectrum antibiotics. Signed out to Dr. Elisabeth Pigeon for [facility]. Discussed with the patient who understands the plan for admission is willing to comply.      Myrna Blazer, MD 09/18/15 [phone]

## 2015-09-18 NOTE — ED Notes (Signed)
Pt c/o abdominal cramps, Dr. Elisabeth Pigeon made aware.

## 2015-09-18 NOTE — ED Notes (Signed)
EMS states pt complains of weakness and diarrhea that started last night. Pt denies abdominal pain, nausea and vomiting. EMS states husband was recently here for N/V/D.

## 2015-09-19 ENCOUNTER — Inpatient Hospital Stay: Payer: Medicare Other

## 2015-09-19 LAB — LACTIC ACID, PLASMA: Lactic Acid, Venous: 0.8 mmol/L (ref 0.5–2.0)

## 2015-09-19 LAB — CBC
HEMATOCRIT: 35.1 % (ref 35.0–47.0)
HEMOGLOBIN: 11.7 g/dL — AB (ref 12.0–16.0)
MCH: 31.1 pg (ref 26.0–34.0)
MCHC: 33.2 g/dL (ref 32.0–36.0)
MCV: 93.5 fL (ref 80.0–100.0)
Platelets: 144 10*3/uL — ABNORMAL LOW (ref 150–440)
RBC: 3.75 MIL/uL — ABNORMAL LOW (ref 3.80–5.20)
RDW: 13.5 % (ref 11.5–14.5)
WBC: 6.7 10*3/uL (ref 3.6–11.0)

## 2015-09-19 LAB — BASIC METABOLIC PANEL
ANION GAP: 6 (ref 5–15)
BUN: 13 mg/dL (ref 6–20)
CHLORIDE: 110 mmol/L (ref 101–111)
CO2: 23 mmol/L (ref 22–32)
Calcium: 7.8 mg/dL — ABNORMAL LOW (ref 8.9–10.3)
Creatinine, Ser: 0.56 mg/dL (ref 0.44–1.00)
GFR calc Af Amer: >60 mL/min (ref >60)
GFR calc non Af Amer: >60 mL/min (ref >60)
GLUCOSE: 91 mg/dL (ref 65–99)
POTASSIUM: 3.4 mmol/L — AB (ref 3.5–5.1)
Sodium: 139 mmol/L (ref 135–145)

## 2015-09-19 LAB — MAGNESIUM: Magnesium: 1.6 mg/dL — ABNORMAL LOW (ref 1.7–2.4)

## 2015-09-19 MED ORDER — POTASSIUM CHLORIDE CRYS ER 20 MEQ PO TBCR: 40.0000 meq | EXTENDED_RELEASE_TABLET | Freq: Once | ORAL | Status: DC

## 2015-09-19 MED ORDER — ACETAMINOPHEN 325 MG PO TABS
650.0000 mg | ORAL_TABLET | Freq: Four times a day (QID) | ORAL | Status: DC | PRN
  Administered 2015-09-19: 650 mg via ORAL

## 2015-09-19 MED ORDER — POTASSIUM CHLORIDE CRYS ER 20 MEQ PO TBCR
40.0000 meq | EXTENDED_RELEASE_TABLET | Freq: Once | ORAL | Status: AC
  Administered 2015-09-19: 40 meq via ORAL
  Filled 2015-09-19: qty 2

## 2015-09-19 MED ORDER — MAGNESIUM SULFATE 2 GM/50ML IV SOLN
2.0000 g | Freq: Once | INTRAVENOUS | Status: AC
  Administered 2015-09-19: 2 g via INTRAVENOUS
  Filled 2015-09-19: qty 50

## 2015-09-19 MED ORDER — INFLUENZA VAC SPLIT QUAD 0.5 ML IM SUSY
0.5000 mL | PREFILLED_SYRINGE | INTRAMUSCULAR | Status: AC
Start: 2015-09-20 — End: 2015-09-21
  Administered 2015-09-21: 0.5 mL via INTRAMUSCULAR
  Filled 2015-09-19 (×2): qty 0.5

## 2015-09-19 MED ORDER — PNEUMOCOCCAL VAC POLYVALENT 25 MCG/0.5ML IJ INJ
0.5000 mL | INJECTION | INTRAMUSCULAR | Status: AC
  Administered 2015-09-20: 0.5 mL via INTRAMUSCULAR
  Filled 2015-09-19: qty 0.5

## 2015-09-19 NOTE — Evaluation (Signed)
Physical Therapy Evaluation Patient Details Name: Felicia [name] MRN: [id number] DOB: 21-May-1928 Today's Date: 09/19/2015   History of Present Illness  Pt is an 82 y.o. female presenting to hospital with weakness and diarrhea (pt's husband recently admitted with similar symptoms and pt's caregiver and family member with similar symptoms).  PMH includes thalamic infarction, htn, dementia, supra-pubic cath.  Clinical Impression  Prior to admission, pt reports being independent without AD (pt appears to be poor historian and unable to elaborate much on PLOF and home situation).  Pt lives with her husband (who also has dementia) in 1 level home with stairs to enter.  Currently pt is min assist with bed mobility and to take a few steps bed to/from commode.  Pt declined further functional mobility d/t feeling exhausted and weak and needing to lay down.  Pt would benefit from skilled PT to address noted impairments and functional limitations.  Currently recommend pt discharge to STR (pending pt's progress) when medically appropriate.     Follow Up Recommendations SNF (pending pt's progress)    Equipment Recommendations  Rolling walker with 5" wheels    Recommendations for Other Services       Precautions / Restrictions Precautions Precautions: Fall Precaution Comments: Droplet/contact precautions Restrictions Weight Bearing Restrictions: No      Mobility  Bed Mobility Overal bed mobility: Needs Assistance Bed Mobility: Supine to Sit;Sit to Supine     Supine to sit: Min assist (assist for trunk) Sit to supine: Min assist (assist to scoot up in bed)   General bed mobility comments: vc's required for technique  Transfers Overall transfer level: Needs assistance Equipment used: None Transfers: Sit to/from Stand Sit to Stand: Min assist            Ambulation/Gait Ambulation/Gait assistance: Min assist Ambulation Distance (Feet):  (bed to/from commode 3 feet x2) Assistive  device: None   Gait velocity: decreased   General Gait Details: decreased B step length/foot clearance/heelstrike; mildly unsteady  Stairs            Wheelchair Mobility    Modified Rankin (Stroke Patients Only)       Balance Overall balance assessment: Needs assistance Sitting-balance support: Bilateral upper extremity supported;Feet supported Sitting balance-Leahy Scale: Good     Standing balance support: Single extremity supported Standing balance-Leahy Scale: Fair Standing balance comment: static standing                             Pertinent Vitals/Pain Pain Assessment: No/denies pain    Home Living Family/patient expects to be discharged to:: Private residence Living Arrangements: Spouse/significant other Available Help at Discharge: Family Type of Home: House Home Access: Stairs to enter Entrance Stairs-Rails:  (pt reports having a railing but unsure where) Entrance Stairs-Number of Steps: 2-3 Home Layout: One level        Prior Function Level of Independence: Independent         Comments: Pt reports being independent without AD although her husband uses a walker.  Per chart a caregiver is involved within the home but pt unable to elaborate on this when asked.     Hand Dominance        Extremity/Trunk Assessment   Upper Extremity Assessment: Generalized weakness           Lower Extremity Assessment: Generalized weakness         Communication   Communication: No difficulties  Cognition Arousal/Alertness: Awake/alert Behavior During Therapy:  (  Mildly impulsive) Overall Cognitive Status:  (Pt did not answer any of therapists questions related to person, place, time, situation)                      General Comments   Nursing cleared pt for participation in physical therapy.  Pt agreeable to limited PT session.    Exercises        Assessment/Plan    PT Assessment Patient needs continued PT services  PT  Diagnosis Difficulty walking;Generalized weakness   PT Problem List Decreased strength;Decreased activity tolerance;Decreased balance;Decreased mobility;Decreased cognition;Decreased knowledge of use of DME;Decreased safety awareness  PT Treatment Interventions DME instruction;Gait training;Stair training;Functional mobility training;Therapeutic activities;Therapeutic exercise;Balance training;Patient/family education   PT Goals (Current goals can be found in the Care Plan section) Acute Rehab PT Goals Patient Stated Goal: to feel better PT Goal Formulation: With patient Time For Goal Achievement: 10/03/15 Potential to Achieve Goals: Good    Frequency Min 2X/week   Barriers to discharge   Question level of support available    Co-evaluation               End of Session Equipment Utilized During Treatment: Gait belt Activity Tolerance: Patient tolerated treatment well Patient left: in bed;with call bell/phone within reach;with bed alarm set;with SCD's reapplied Nurse Communication: Mobility status;Precautions         Time: 5621-3086 PT Time Calculation (min) (ACUTE ONLY): 22 min   Charges:   PT Evaluation $PT Eval Moderate Complexity: 1 Procedure     PT G CodesHendricks Limes 10-02-15, 4:27 PM Hendricks Limes, PT [phone]

## 2015-09-19 NOTE — Progress Notes (Signed)
[facility] - [place] at [facility]   PATIENT NAME: Felicia [name]    MR#:  [id number]  DATE OF BIRTH:  03/03/1928  SUBJECTIVE:  CHIEF COMPLAINT:   Chief Complaint  Patient presents with  . Weakness  . Diarrhea   Fever and cough. REVIEW OF SYSTEMS:  CONSTITUTIONAL: Has fever, generalized weakness.  EYES: No blurred or double vision.  EARS, NOSE, AND THROAT: No tinnitus or ear pain.  RESPIRATORY: Has cough, no shortness of breath, wheezing or hemoptysis.  CARDIOVASCULAR: No chest pain, orthopnea, edema.  GASTROINTESTINAL: No nausea, vomiting, diarrhea or abdominal pain.  GENITOURINARY: No dysuria, hematuria.  ENDOCRINE: No polyuria, nocturia,  HEMATOLOGY: No anemia, easy bruising or bleeding SKIN: No rash or lesion. MUSCULOSKELETAL: No joint pain or arthritis.   NEUROLOGIC: No tingling, numbness, weakness.  PSYCHIATRY: No anxiety or depression.   DRUG ALLERGIES:  No Known Allergies  VITALS:  Blood pressure 102/49, pulse 83, temperature 99.7 F (37.6 C), temperature source Oral, resp. rate 18, height  (1.575 m), weight 57.335 kg (126 lb 6.4 oz), SpO2 97 %.  PHYSICAL EXAMINATION:  GENERAL:  82 y.o.-year-old patient lying in the bed with no acute distress.  EYES: Pupils equal, round, reactive to light and accommodation. No scleral icterus. Extraocular muscles intact.  HEENT: Head atraumatic, normocephalic. Oropharynx and nasopharynx clear.  NECK:  Supple, no jugular venous distention. No thyroid enlargement, no tenderness.  LUNGS: Normal breath sounds bilaterally, no wheezing, rales,rhonchi or crepitation. No use of accessory muscles of respiration.  CARDIOVASCULAR: S1, S2 normal. No murmurs, rubs, or gallops.  ABDOMEN: Soft, nontender, nondistended. Bowel sounds present. No organomegaly or mass.  EXTREMITIES: No pedal edema, cyanosis, or clubbing.  NEUROLOGIC: Cranial nerves II through XII are intact. Muscle strength 4/5 in all extremities.  Sensation intact. Gait not checked.  PSYCHIATRIC: The patient is alert and oriented x 3.  SKIN: No obvious rash, lesion, or ulcer.    LABORATORY PANEL:   CBC  Recent Labs Lab 09/19/15 0456  WBC 6.7  HGB 11.7*  HCT 35.1  PLT 144*   ------------------------------------------------------------------------------------------------------------------  Chemistries   Recent Labs Lab 09/18/15 1346 09/19/15 0456  NA 139 139  K 3.6 3.4*  CL 105 110  CO2 28 23  GLUCOSE 103* 91  BUN 18 13  CREATININE 0.52 0.56  CALCIUM 9.0 7.8*  MG  --  1.6*  AST 32  --   ALT 21  --   ALKPHOS 38  --   BILITOT 0.5  --    ------------------------------------------------------------------------------------------------------------------  Cardiac Enzymes  Recent Labs Lab 09/18/15 1346  TROPONINI <0.03   ------------------------------------------------------------------------------------------------------------------  RADIOLOGY:  Dg Chest 2 View  09/19/2015  CLINICAL DATA:  Weakness for 3 days.  Hypertension and cough EXAM: CHEST  2 VIEW COMPARISON:  September 18, 2015 FINDINGS: There is no edema or consolidation. The heart size and pulmonary vascularity are normal. No adenopathy. There is thoracolumbar levoscoliosis. There is degenerative change in the thoracic spine. IMPRESSION: No edema or consolidation. Electronically Signed   By: Bretta Bang III M.D.   On: 09/19/2015 07:33   Dg Chest 2 View  09/18/2015  CLINICAL DATA:  Weakness and diarrhea EXAM: CHEST  2 VIEW COMPARISON:  04/30/2013 FINDINGS: Cardiac shadow is within normal limits. Aortic calcifications are again seen. Lungs are clear bilaterally. No sizable effusion or infiltrate is noted. No acute bony abnormality is noted. IMPRESSION: No active cardiopulmonary disease. Electronically Signed   By: Alcide Clever M.D.   On: 09/18/2015  18:50   Ct Abdomen Pelvis W Contrast  09/18/2015  CLINICAL DATA:  Weakness and diarrhea starting last  night. EXAM: CT ABDOMEN AND PELVIS WITH CONTRAST TECHNIQUE: Multidetector CT imaging of the abdomen and pelvis was performed using the standard protocol following bolus administration of intravenous contrast. CONTRAST:  OMNIPAQUE IOHEXOL 300 MG/ML  SOLN COMPARISON:  07/07/2013 FINDINGS: Motion degraded but overall diagnostic. Lower chest and abdominal wall:  Negative Hepatobiliary: No focal liver abnormality.Full gallbladder without visible stone or superimposed inflammation Pancreas: Unremarkable. Spleen: Unremarkable. Adrenals/Urinary Tract: Negative adrenals. No hydronephrosis or stone. 47 mm exophytic cyst from the lower pole left kidney versus lymphatic cysts. Unremarkable bladder. Reproductive:Minimal growth (5 mm) of a 27 mm right ovarian cyst since 2014. The appearance is still overall benign. Stomach/Bowel:  No obstruction. No appendicitis. Vascular/Lymphatic: No acute vascular abnormality. No mass or adenopathy. Peritoneal: No ascites or pneumoperitoneum. Musculoskeletal: No acute abnormalities. Osteopenia. Disc and facet degeneration with L4-5 anterolisthesis. IMPRESSION: No acute finding or significant change since 2014. Electronically Signed   By: Marnee Spring M.D.   On: 09/18/2015 21:37    EKG:   Orders placed or performed during the hospital encounter of 09/18/15  . ED EKG  . ED EKG    ASSESSMENT AND PLAN:   Fever of unknown origin, still fever 101.9 this am.  Most likely viral infection f/u Blood cultures and urine cultures. continue IVF and IV Vanco and Zosyn. ID consult. Her influenza test is negative.   * Hypertension Hold her medication due to low side BP.  * Confusion. Better.  * Possible diarrhea As per ER physician she complained of diarrhea one time while she came to ER. She did not had any episode over here. check stool if she has any more diarrhea.  Hypokalemia. KCl, f/u BMP. Hypomagnesemia. Mag iv, f/u mag.   All the records are reviewed and  case discussed with Care Management/Social Workerr. Management plans discussed with the patient, family and they are in agreement.  CODE STATUS: full code.  TOTAL TIME TAKING CARE OF THIS PATIENT: 37 minutes.  Greater than 50% time was spent on coordination of care and face-to-face counseling.  POSSIBLE D/C IN 2 DAYS, DEPENDING ON CLINICAL CONDITION.   Shaune Pollack M.D on 09/19/2015 at 2:04 PM  Between 7am to 6pm - Pager - [phone]  After 6pm go to [email or web address] - password [email or web address] [facility]  [facility]  Office  [phone]  CC: Primary care physician; Baruch Gouty, MD

## 2015-09-19 NOTE — Progress Notes (Signed)
Plan to transfer pt to 111 on 1-C when bed comes available.

## 2015-09-19 NOTE — Progress Notes (Signed)
Pt just arrived to our floor from 1A, pt lungs sound coarse, wet, Dr Elisabeth Pigeon made aware new order to discontinue IV fluids

## 2015-09-19 NOTE — Progress Notes (Addendum)
Pt family here, states that pt's husband is on adjacent unit 1-C. All family members who have been in contact with pt and husband are experiencing high fevers and headaches. Concerned about infectious disease. Placed pt on droplet precautions. Family has also requested that pt be moved to same unit as husband if she will be admitted much longer. MD notified. No new orders at this time.

## 2015-09-19 NOTE — Consult Note (Signed)
[facility] Infectious Disease     Reason for Consult:Fever    Referring Physician: Michaelle Birks Date of Admission:  09/18/2015   Principal Problem:   Fever   HPI: Felicia [name] is a 82 y.o. female admitted with fevers, weakness and diarrhea. Husband admitted with similar sxs She is a poor historian and cannot tell me why she came to hospital. She has not had any diarrhea since admit but has had fevers. Her wbc is nml, bcx neg. CXR neg, UA UCX neg, Flu negative, lactate nml CT abd negative Her husband is admitted with similar sxs and per report is having diarrhea  HAs been on vanco and zosyn Lives with husband, daughter and granddaughter  Past Medical History  Diagnosis Date  . Rosacea   . Hyperlipidemia   . Hypertension   . Osteoporosis   . Dementia   . Vitamin D deficiency disease   . Urinary retention     chronic  . Condyloma acuminatum   . Thalamic infarction ([place])   . Post-menopausal    Past Surgical History  Procedure Laterality Date  . Suprapubic catheter placement  Nov. 2014   Social History  Substance Use Topics  . Smoking status: Never Smoker   . Smokeless tobacco: Never Used  . Alcohol Use: No   Family History  Problem Relation Age of Onset  . CAD Father     Allergies: No Known Allergies  Current antibiotics: Antibiotics Given (last 72 hours)    Date/Time Action Medication Dose Rate   09/19/15 0130 Given   piperacillin-tazobactam (ZOSYN) IVPB 3.375 g 3.375 g 12.5 mL/hr   09/19/15 1216 Given   piperacillin-tazobactam (ZOSYN) IVPB 3.375 g 3.375 g 12.5 mL/hr   09/19/15 1216 Given   vancomycin (VANCOCIN) IVPB 1000 mg/200 mL premix 1,000 mg 200 mL/hr      MEDICATIONS: . atorvastatin  10 mg Oral QHS  . escitalopram  5 mg Oral Daily  . heparin  5,000 Units Subcutaneous 3 times per day  . magnesium sulfate 1 - 4 g bolus IVPB  2 g Intravenous Once  . piperacillin-tazobactam (ZOSYN)  IV  3.375 g Intravenous 3 times per day  . rivastigmine  3 mg Oral BID   . vancomycin  1,000 mg Intravenous Q24H  . vitamin B-12  1,000 mcg Oral Daily    Review of Systems - 11 systems reviewed and negative per HPI   OBJECTIVE: Temp:  [98.1 F (36.7 C)-102.6 F (39.2 C)] 99.7 F (37.6 C) (02/17 1342) Pulse Rate:  [75-102] 83 (02/17 1104) Resp:  [16-28] 18 (02/17 1104) BP: (92-124)/(49-75) 102/49 mmHg (02/17 1104) SpO2:  [93 %-100 %] 97 % (02/17 1104) Weight:  [57.335 kg (126 lb 6.4 oz)] 57.335 kg (126 lb 6.4 oz) (02/16 2138) Physical Exam  Constitutional:  alert.  frail,  HENT: [place]/AT, PERRLA, no scleral icterus Mouth/Throat: Oropharynx is clear and dry . No oropharyngeal exudate.  Cardiovascular: Normal rate, regular rhythm and normal heart sounds.  Pulmonary/Chest: Effort normal and breath sounds normal. No respiratory distress.  has no wheezes.  Neck  supple, no nuchal rigidity Abdominal: Soft. Bowel sounds are normal.  exhibits no distension. There is very mild diffuse tenderness.  Lymphadenopathy: no cervical adenopathy. No axillary adenopathy Neurological: alert and interactive Skin: Skin is warm and dry. No rash noted. No erythema.  Psychiatric: a normal mood and affect.  behavior is normal.    LABS: Results for orders placed or performed during the hospital encounter of 09/18/15 (from the past  48 hour(s))  CBC with Differential     Status: Abnormal   Collection Time: 09/18/15  1:46 PM  Result Value Ref Range   WBC 8.3 3.6 - 11.0 K/uL   RBC 4.00 3.80 - 5.20 MIL/uL   Hemoglobin 12.3 12.0 - 16.0 g/dL   HCT 35.8 35.0 - 47.0 %   MCV 89.3 80.0 - 100.0 fL   MCH 30.6 26.0 - 34.0 pg   MCHC 34.3 32.0 - 36.0 g/dL   RDW 13.4 11.5 - 14.5 %   Platelets 171 150 - 440 K/uL   Neutrophils Relative % 89 %   Neutro Abs 7.3 (H) 1.4 - 6.5 K/uL   Lymphocytes Relative 2 %   Lymphs Abs 0.2 (L) 1.0 - 3.6 K/uL   Monocytes Relative 9 %   Monocytes Absolute 0.8 0.2 - 0.9 K/uL   Eosinophils Relative 0 %   Eosinophils Absolute 0.0 0 - 0.7 K/uL   Basophils  Relative 0 %   Basophils Absolute 0.0 0 - 0.1 K/uL  Comprehensive metabolic panel     Status: Abnormal   Collection Time: 09/18/15  1:46 PM  Result Value Ref Range   Sodium 139 135 - 145 mmol/L   Potassium 3.6 3.5 - 5.1 mmol/L   Chloride 105 101 - 111 mmol/L   CO2 28 22 - 32 mmol/L   Glucose, Bld 103 (H) 65 - 99 mg/dL   BUN 18 6 - 20 mg/dL   Creatinine, Ser 0.52 0.44 - 1.00 mg/dL   Calcium 9.0 8.9 - 10.3 mg/dL   Total Protein 6.1 (L) 6.5 - 8.1 g/dL   Albumin 3.8 3.5 - 5.0 g/dL   AST 32 15 - 41 U/L   ALT 21 14 - 54 U/L   Alkaline Phosphatase 38 38 - 126 U/L   Total Bilirubin 0.5 0.3 - 1.2 mg/dL   GFR calc non Af Amer >60 >60 mL/min   GFR calc Af Amer >60 >60 mL/min    Comment: (NOTE) The eGFR has been calculated using the CKD EPI equation. This calculation has not been validated in all clinical situations. eGFR's persistently <60 mL/min signify possible Chronic Kidney Disease.    Anion gap 6 5 - 15  Troponin I     Status: None   Collection Time: 09/18/15  1:46 PM  Result Value Ref Range   Troponin I <0.03 <0.031 ng/mL    Comment:        NO INDICATION OF MYOCARDIAL INJURY.   Urinalysis complete, with microscopic     Status: Abnormal   Collection Time: 09/18/15  1:46 PM  Result Value Ref Range   Color, Urine YELLOW (A) YELLOW   APPearance CLEAR (A) CLEAR   Glucose, UA NEGATIVE NEGATIVE mg/dL   Bilirubin Urine NEGATIVE NEGATIVE   Ketones, ur TRACE (A) NEGATIVE mg/dL   Specific Gravity, Urine 1.023 1.005 - 1.030   Hgb urine dipstick NEGATIVE NEGATIVE   pH 6.0 5.0 - 8.0   Protein, ur NEGATIVE NEGATIVE mg/dL   Nitrite NEGATIVE NEGATIVE   Leukocytes, UA NEGATIVE NEGATIVE   RBC / HPF 0-5 0 - 5 RBC/hpf   WBC, UA 0-5 0 - 5 WBC/hpf   Bacteria, UA NONE SEEN NONE SEEN   Squamous Epithelial / LPF NONE SEEN NONE SEEN   Mucous PRESENT   Lipase, blood     Status: None   Collection Time: 09/18/15  1:46 PM  Result Value Ref Range   Lipase 25 11 - 51 U/L  Urine culture  Status: None (Preliminary result)   Collection Time: 09/18/15  5:22 PM  Result Value Ref Range   Specimen Description URINE, CLEAN CATCH    Special Requests NONE    Culture NO GROWTH < 24 HOURS    Report Status PENDING   Rapid Influenza A&B Antigens ([facility] only)     Status: None   Collection Time: 09/18/15  6:13 PM  Result Value Ref Range   Influenza A ([facility]) NEGATIVE    Influenza B ([facility]) NEGATIVE   CBC WITH DIFFERENTIAL     Status: Abnormal   Collection Time: 09/18/15  7:18 PM  Result Value Ref Range   WBC 6.3 3.6 - 11.0 K/uL   RBC 3.48 (L) 3.80 - 5.20 MIL/uL   Hemoglobin 10.8 (L) 12.0 - 16.0 g/dL   HCT 31.5 (L) 35.0 - 47.0 %   MCV 90.6 80.0 - 100.0 fL   MCH 30.9 26.0 - 34.0 pg   MCHC 34.1 32.0 - 36.0 g/dL   RDW 13.1 11.5 - 14.5 %   Platelets 153 150 - 440 K/uL   Neutrophils Relative % 87 %   Neutro Abs 5.4 1.4 - 6.5 K/uL   Lymphocytes Relative 4 %   Lymphs Abs 0.3 (L) 1.0 - 3.6 K/uL   Monocytes Relative 9 %   Monocytes Absolute 0.6 0.2 - 0.9 K/uL   Eosinophils Relative 0 %   Eosinophils Absolute 0.0 0 - 0.7 K/uL   Basophils Relative 0 %   Basophils Absolute 0.0 0 - 0.1 K/uL  Lactic acid, plasma     Status: None   Collection Time: 09/18/15  7:18 PM  Result Value Ref Range   Lactic Acid, Venous 1.0 0.5 - 2.0 mmol/L  Lactic acid, plasma     Status: None   Collection Time: 09/18/15 11:19 PM  Result Value Ref Range   Lactic Acid, Venous 0.8 0.5 - 2.0 mmol/L  Basic metabolic panel     Status: Abnormal   Collection Time: 09/19/15  4:56 AM  Result Value Ref Range   Sodium 139 135 - 145 mmol/L   Potassium 3.4 (L) 3.5 - 5.1 mmol/L   Chloride 110 101 - 111 mmol/L   CO2 23 22 - 32 mmol/L   Glucose, Bld 91 65 - 99 mg/dL   BUN 13 6 - 20 mg/dL   Creatinine, Ser 0.56 0.44 - 1.00 mg/dL   Calcium 7.8 (L) 8.9 - 10.3 mg/dL   GFR calc non Af Amer >60 >60 mL/min   GFR calc Af Amer >60 >60 mL/min    Comment: (NOTE) The eGFR has been calculated using the CKD EPI equation. This  calculation has not been validated in all clinical situations. eGFR's persistently <60 mL/min signify possible Chronic Kidney Disease.    Anion gap 6 5 - 15  CBC     Status: Abnormal   Collection Time: 09/19/15  4:56 AM  Result Value Ref Range   WBC 6.7 3.6 - 11.0 K/uL   RBC 3.75 (L) 3.80 - 5.20 MIL/uL   Hemoglobin 11.7 (L) 12.0 - 16.0 g/dL   HCT 35.1 35.0 - 47.0 %   MCV 93.5 80.0 - 100.0 fL   MCH 31.1 26.0 - 34.0 pg   MCHC 33.2 32.0 - 36.0 g/dL   RDW 13.5 11.5 - 14.5 %   Platelets 144 (L) 150 - 440 K/uL  Magnesium     Status: Abnormal   Collection Time: 09/19/15  4:56 AM  Result Value Ref Range   Magnesium  1.6 (L) 1.7 - 2.4 mg/dL   No components found for: ESR, C REACTIVE PROTEIN MICRO: Recent Results (from the past 720 hour(s))  Urine culture     Status: None (Preliminary result)   Collection Time: 09/18/15  5:22 PM  Result Value Ref Range Status   Specimen Description URINE, CLEAN CATCH  Final   Special Requests NONE  Final   Culture NO GROWTH < 24 HOURS  Final   Report Status PENDING  Incomplete  Rapid Influenza A&B Antigens ([facility] only)     Status: None   Collection Time: 09/18/15  6:13 PM  Result Value Ref Range Status   Influenza A ([facility]) NEGATIVE  Final   Influenza B ([facility]) NEGATIVE  Final    IMAGING: Dg Chest 2 View  09/19/2015  CLINICAL DATA:  Weakness for 3 days.  Hypertension and cough EXAM: CHEST  2 VIEW COMPARISON:  September 18, 2015 FINDINGS: There is no edema or consolidation. The heart size and pulmonary vascularity are normal. No adenopathy. There is thoracolumbar levoscoliosis. There is degenerative change in the thoracic spine. IMPRESSION: No edema or consolidation. Electronically Signed   By: Bretta Bang III M.D.   On: 09/19/2015 07:33   Dg Chest 2 View  09/18/2015  CLINICAL DATA:  Weakness and diarrhea EXAM: CHEST  2 VIEW COMPARISON:  04/30/2013 FINDINGS: Cardiac shadow is within normal limits. Aortic calcifications are again seen. Lungs are  clear bilaterally. No sizable effusion or infiltrate is noted. No acute bony abnormality is noted. IMPRESSION: No active cardiopulmonary disease. Electronically Signed   By: Alcide Clever M.D.   On: 09/18/2015 18:50   Ct Abdomen Pelvis W Contrast  09/18/2015  CLINICAL DATA:  Weakness and diarrhea starting last night. EXAM: CT ABDOMEN AND PELVIS WITH CONTRAST TECHNIQUE: Multidetector CT imaging of the abdomen and pelvis was performed using the standard protocol following bolus administration of intravenous contrast. CONTRAST:  OMNIPAQUE IOHEXOL 300 MG/ML  SOLN COMPARISON:  07/07/2013 FINDINGS: Motion degraded but overall diagnostic. Lower chest and abdominal wall:  Negative Hepatobiliary: No focal liver abnormality.Full gallbladder without visible stone or superimposed inflammation Pancreas: Unremarkable. Spleen: Unremarkable. Adrenals/Urinary Tract: Negative adrenals. No hydronephrosis or stone. 47 mm exophytic cyst from the lower pole left kidney versus lymphatic cysts. Unremarkable bladder. Reproductive:Minimal growth (5 mm) of a 27 mm right ovarian cyst since 2014. The appearance is still overall benign. Stomach/Bowel:  No obstruction. No appendicitis. Vascular/Lymphatic: No acute vascular abnormality. No mass or adenopathy. Peritoneal: No ascites or pneumoperitoneum. Musculoskeletal: No acute abnormalities. Osteopenia. Disc and facet degeneration with L4-5 anterolisthesis. IMPRESSION: No acute finding or significant change since 2014. Electronically Signed   By: Marnee Spring M.D.   On: 09/18/2015 21:37    Assessment:   Felicia [name] is a 82 y.o. female admitted with fevers, possible diarrhea but none since admission. Her wbc is nml, bcx neg. CXR neg, UA UCX neg, Flu negative, lactate nml CT abd negative Her husband is admitted with similar sxs and per report is having diarrhea  Has been on vanco and zosyn.  Lives with husband, daughter and granddaughter - her daughter also has flu like sxs as  does a caregiver.  I suspect viral gastroenteritis.   Recommendations Stop antibiotics I spoke with Daughter and told likely viral GE.  I have ordered stool PCR on her husband to see if we can ID a pathogen Would monitor for now off abx  Thank you very much for allowing me to participate in the care of  this patient. Please call with questions.   Cheral Marker. Ola Spurr, MD

## 2015-09-19 NOTE — Progress Notes (Signed)
Per pt had flu vaccine at Dr. Charisse March office this year, is unsure of PNA vaccine.

## 2015-09-19 NOTE — [place] FL2 (Signed)
[place] MEDICAID FL2 LEVEL OF CARE SCREENING TOOL     IDENTIFICATION  Patient Name: Felicia [name] Birthdate: 12-22-1927 Sex: female Admission Date (Current Location): 09/18/2015  [place] and [place] Number:  Chiropodist and Address:  [facility], [address], [place], [place] [address]      Provider Number: [id number]  Attending Physician Name and Address:  Shaune Pollack, MD  Relative Name and Phone Number:       Current Level of Care: Hospital Recommended Level of Care: Skilled Nursing Facility Prior Approval Number:    Date Approved/Denied:   PASRR Number:  [id number])  Discharge Plan: SNF    Current Diagnoses: Patient Active Problem List   Diagnosis Date Noted  . Fever 09/18/2015  . Medication monitoring encounter 03/21/2015  . Vitamin B12 deficiency 03/21/2015  . Breast cancer screening 03/21/2015  . Rosacea   . Hyperlipidemia   . Hypertension   . Osteoporosis   . Dementia   . Vitamin D deficiency disease   . Urinary retention   . Condyloma acuminatum   . Thalamic infarction ([facility])     Orientation RESPIRATION BLADDER Height & Weight     Self, Place  Normal Continent Weight: 126 lb 6.4 oz (57.335 kg) Height:   (157.5 cm)  BEHAVIORAL SYMPTOMS/MOOD NEUROLOGICAL BOWEL NUTRITION STATUS   (none )  (none ) Continent Diet (Diet: 2 Grams Sodium. )  AMBULATORY STATUS COMMUNICATION OF NEEDS Skin   Extensive Assist Verbally Normal                       Personal Care Assistance Level of Assistance  Bathing, Feeding, Dressing Bathing Assistance: Limited assistance Feeding assistance: Independent Dressing Assistance: Limited assistance     Functional Limitations Info  Sight, Hearing, Speech Sight Info: Adequate Hearing Info: Adequate Speech Info: Adequate    SPECIAL CARE FACTORS FREQUENCY  PT (By licensed PT), OT (By licensed OT)     PT Frequency:  (5) OT Frequency:  (5)            Contractures       Additional Factors Info  Code Status, Isolation Precautions Code Status Info:  (Full Code. )       Isolation Precautions Info:  (Droplet Precations (Negative for the flu)  )     Current Medications (09/19/2015):  This is the current hospital active medication list Current Facility-Administered Medications  Medication Dose Route Frequency Provider Last Rate Last Dose  . 0.9 %  sodium chloride infusion   Intravenous Continuous Altamese Dilling, MD 75 mL/hr at 09/19/15 0553    . acetaminophen (TYLENOL) tablet 650 mg  650 mg Oral Q6H PRN Shaune Pollack, MD   650 mg at 09/19/15 0847  . atorvastatin (LIPITOR) tablet 10 mg  10 mg Oral QHS Altamese Dilling, MD   10 mg at 09/18/15 2205  . escitalopram (LEXAPRO) tablet 5 mg  5 mg Oral Daily Altamese Dilling, MD   5 mg at 09/19/15 0808  . heparin injection 5,000 Units  5,000 Units Subcutaneous 3 times per day Altamese Dilling, MD   5,000 Units at 09/19/15 0531  . rivastigmine (EXELON) capsule 3 mg  3 mg Oral BID Altamese Dilling, MD   3 mg at 09/18/15 2338  . vitamin B-12 (CYANOCOBALAMIN) tablet 1,000 mcg  1,000 mcg Oral Daily Altamese Dilling, MD   1,000 mcg at 09/19/15 0808     Discharge Medications: Please see discharge summary for a list of  discharge medications.  Relevant Imaging Results:  Relevant Lab Results:   Additional Information  (SSN: [id number])  Haig Prophet, LCSW

## 2015-09-19 NOTE — Care Management Note (Signed)
Case Management Note  Patient Details  Name: Felicia [name] MRN: [id number] Date of Birth: 05-Feb-1928  Subjective/Objective:                  Met with patient to discuss discharge planning. She states she lives with her husband whom drives. She states her PCP is Dr. Brandon Melnick. She uses Technical sales engineer for [employer]. She does not depend on DME for ambulation but her husband does. She denies owning any for herself. She is not on O2 at home.    Action/Plan: List of home health agencies left with patient. RNCM will continue to follow.    Expected Discharge Date:                  Expected Discharge Plan:     In-House Referral:     Discharge planning Services     Post Acute Care Choice:    Choice offered to:  Patient  DME Arranged:    DME Agency:     HH Arranged:    [place] Agency:     Status of Service:  In process, will continue to follow  Medicare Important Message Given:    Date Medicare IM Given:    Medicare IM give by:    Date Additional Medicare IM Given:    Additional Medicare Important Message give by:     If discussed at [place] of Stay Meetings, dates discussed:    Additional Comments:  Marshell Garfinkel, RN 09/19/2015, 10:35 AM

## 2015-09-19 NOTE — Clinical Social Work Note (Signed)
Clinical Social Work Assessment  Patient Details  Name: Felicia [name] MRN: [id number] Date of Birth: May 04, 1928  Date of referral:  09/19/15               Reason for consult:  Facility Placement                Permission sought to share information with:  Oceanographer granted to share information::  Yes, Verbal Permission Granted  Name::      Risk manager::   Skilled Nursing Facility   Relationship::     Contact Information:     Housing/Transportation Living arrangements for the past 2 months:  Single Family Home Source of Information:  Power of Selz, Adult Children Patient Interpreter Needed:  None Criminal Activity/Legal Involvement Pertinent to Current Situation/Hospitalization:  No - Comment as needed Significant Relationships:  Adult Children, Spouse Lives with:  Spouse Do you feel safe going back to the place where you live?   (Unable to assess ) Need for family participation in patient care:  Yes (Comment)  Care giving concerns:  Patient lives in [place] with her husband Felicia [name].    Social Worker assessment / plan: Visual merchandiser (CSW) reviewed chart and noted that PT is recommending SNF for STR. Patient's husband Aneth Schlagel is also admitted to [facility] on 1C. PT is also recommending SNF for patient's husband Felicia [name]. Patient and husband are not oriented per chart and no family is at bedside. CSW contacted patient's daughter Felicia [name] [phone] to complete assessment. Per Felicia [name] she and her 2 brothers share POA for patient and husband. Per daughter patient and husband live in [place] and [place] lives in [place]. Per Felicia [name] patient went to rehab 2 years ago for a broken hip. Per daughter patient and husband both have dementia however their long term memories are better then their short term memories. Daughter is agreeable to SNF search and prefers a facility that can accept both patient and her husband.   Per [facility]  admissions coordinator at [facility] they can accept both patient and her husband Felicia [name]). Per [place] patient and husband will have private rooms and can come to [facility] over the weekend if medically stable for D/C. Patient's daughter Felicia [name] is in agreement with patient and husband going to [facility] for rehab. CSW will continue to follow and assist as needed.    Employment status:  Retired Database administrator PT Recommendations:  Skilled Nursing Facility Information / Referral to community resources:  Skilled Nursing Facility  Patient/Family's Response to care:  Patient's daughter Felicia [name] is agreeable for patient to go to [facility] for [employer]   Patient/Family's Understanding of and Emotional Response to Diagnosis, Current Treatment, and Prognosis:  Patient's daughter was pleasant throughout assessment and thanked CSW for calling.   Emotional Assessment Appearance:  Appears stated age Attitude/Demeanor/Rapport:  Unable to Assess Affect (typically observed):  Unable to Assess Orientation:  Oriented to Self, Oriented to Place, Fluctuating Orientation (Suspected and/or reported Sundowners) Alcohol / Substance use:  Not Applicable Psych involvement (Current and /or in the community):  No (Comment)  Discharge Needs  Concerns to be addressed:  Discharge Planning Concerns Readmission within the last 30 days:  No Current discharge risk:  Chronically ill, Dependent with Mobility Barriers to Discharge:  Continued Medical Work up   Felicia Prophet, LCSW 09/19/2015, 5:05 PM

## 2015-09-19 NOTE — Clinical Social Work Placement (Signed)
   CLINICAL SOCIAL WORK PLACEMENT  NOTE  Date:  09/19/2015  Patient Details  Name: Felicia [name] MRN: [id number] Date of Birth: Dec 19, 1927  Clinical Social Work is seeking post-discharge placement for this patient at the Skilled  Nursing Facility level of care (*CSW will initial, date and re-position this form in  chart as items are completed):  Yes   Patient/family provided with [facility] Clinical Social Work Department's list of facilities offering this level of care within the geographic area requested by the patient (or if unable, by the patient's family).  Yes   Patient/family informed of their freedom to choose among providers that offer the needed level of care, that participate in Medicare, Medicaid or managed care program needed by the patient, have an available bed and are willing to accept the patient.  Yes   Patient/family informed of [facility]'s ownership interest in [facility] and [facility], as well as of the fact that they are under no obligation to receive care at these facilities.  PASRR submitted to EDS on       PASRR number received on       Existing PASRR number confirmed on 09/19/15     FL2 transmitted to all facilities in geographic area requested by pt/family on 09/19/15     FL2 transmitted to all facilities within larger geographic area on       Patient informed that his/her managed care company has contracts with or will negotiate with certain facilities, including the following:        Yes   Patient/family informed of bed offers received.  Patient chooses bed at  [facility] )     Physician recommends and patient chooses bed at      Patient to be transferred to   on  .  Patient to be transferred to facility by       Patient family notified on   of transfer.  Name of family member notified:        PHYSICIAN       Additional Comment:    _______________________________________________ Haig Prophet, LCSW 09/19/2015, 5:03  PM

## 2015-09-19 NOTE — Progress Notes (Signed)
MD said to discontinue telemetry 

## 2015-09-20 DIAGNOSIS — A084 Viral intestinal infection, unspecified: Secondary | ICD-10-CM | POA: Diagnosis not present

## 2015-09-20 LAB — CBC
HEMATOCRIT: 32 % — AB (ref 35.0–47.0)
Hemoglobin: 10.9 g/dL — ABNORMAL LOW (ref 12.0–16.0)
MCH: 31.2 pg (ref 26.0–34.0)
MCHC: 34 g/dL (ref 32.0–36.0)
MCV: 91.6 fL (ref 80.0–100.0)
PLATELETS: 129 10*3/uL — AB (ref 150–440)
RBC: 3.49 MIL/uL — AB (ref 3.80–5.20)
RDW: 13.4 % (ref 11.5–14.5)
WBC: 3.6 10*3/uL (ref 3.6–11.0)

## 2015-09-20 LAB — URINE CULTURE: CULTURE: NO GROWTH

## 2015-09-20 LAB — MAGNESIUM: Magnesium: 2 mg/dL (ref 1.7–2.4)

## 2015-09-20 LAB — BASIC METABOLIC PANEL
ANION GAP: 1 — AB (ref 5–15)
BUN: 11 mg/dL (ref 6–20)
BUN: 11 mg/dL (ref 4–21)
CALCIUM: 7.8 mg/dL — AB (ref 8.9–10.3)
CHLORIDE: 108 mmol/L (ref 101–111)
CO2: 29 mmol/L (ref 22–32)
Creatinine, Ser: 0.53 mg/dL (ref 0.44–1.00)
Creatinine: 0.5 mg/dL (ref 0.5–1.1)
GFR calc non Af Amer: >60 mL/min (ref >60)
GLUCOSE: 91 mg/dL (ref 65–99)
Glucose: 91 mg/dL
POTASSIUM: 3.8 mmol/L (ref 3.5–5.1)
SODIUM: 138 mmol/L (ref 137–147)
Sodium: 138 mmol/L (ref 135–145)

## 2015-09-20 LAB — C DIFFICILE QUICK SCREEN W PCR REFLEX
C DIFFICILE (CDIFF) INTERP: NEGATIVE
C DIFFICILE (CDIFF) TOXIN: NEGATIVE
C Diff antigen: NEGATIVE

## 2015-09-20 LAB — CBC AND DIFFERENTIAL: WBC: 3.6 10*3/mL

## 2015-09-20 MED ORDER — MAGNESIUM SULFATE 4 GM/100ML IV SOLN: 4.0000 g | Freq: Once | INTRAVENOUS | Status: DC

## 2015-09-20 NOTE — Progress Notes (Addendum)
[facility] - [place] [facility]   PATIENT NAME: Felicia [name]    MR#:  [id number]  DATE OF BIRTH:  [birth year]  SUBJECTIVE:  CHIEF COMPLAINT:   Chief Complaint  Patient presents with  . Weakness  . Diarrhea   The patient feels relatively good today, denies any significant discomfort or pain, patient continues to have loose stools but isn't incontinent .  REVIEW OF SYSTEMS:  CONSTITUTIONAL: Has fever, generalized weakness.  EYES: No blurred or double vision.  EARS, NOSE, AND THROAT: No tinnitus or ear pain.  RESPIRATORY: Has cough, no shortness of breath, wheezing or hemoptysis.  CARDIOVASCULAR: No chest pain, orthopnea, edema.  GASTROINTESTINAL: No nausea, vomiting, diarrhea or abdominal pain.  GENITOURINARY: No dysuria, hematuria.  ENDOCRINE: No polyuria, nocturia,  HEMATOLOGY: No anemia, easy bruising or bleeding SKIN: No rash or lesion. MUSCULOSKELETAL: No joint pain or arthritis.   NEUROLOGIC: No tingling, numbness, weakness.  PSYCHIATRY: No anxiety or depression.   DRUG ALLERGIES:  No Known Allergies  VITALS:  Blood pressure 120/61, pulse 67, temperature 99.1 F (37.3 C), temperature source Oral, resp. rate 20, height  (1.575 m), weight 57.335 kg (126 lb 6.4 oz), SpO2 97 %.  PHYSICAL EXAMINATION:  GENERAL:  82 y.o.-year-old patient lying in the bed with no acute distress.  EYES: Pupils equal, round, reactive to light and accommodation. No scleral icterus. Extraocular muscles intact.  HEENT: Head atraumatic, normocephalic. Oropharynx and nasopharynx clear.  NECK:  Supple, no jugular venous distention. No thyroid enlargement, no tenderness.  LUNGS: Normal breath sounds bilaterally, no wheezing, rales,rhonchi or crepitation. No use of accessory muscles of respiration.  CARDIOVASCULAR: S1, S2 normal. No murmurs, rubs, or gallops.  ABDOMEN: Soft, nontender, nondistended. Bowel sounds present. No organomegaly or mass.  EXTREMITIES: No pedal  edema, cyanosis, or clubbing.  NEUROLOGIC: Cranial nerves II through XII are intact. Muscle strength 4/5 in all extremities. Sensation intact. Gait not checked.  PSYCHIATRIC: The patient is alert and oriented x 3.  SKIN: No obvious rash, lesion, or ulcer.    LABORATORY PANEL:   CBC  Recent Labs Lab 09/20/15 0517  WBC 3.6  HGB 10.9*  HCT 32.0*  PLT 129*   ------------------------------------------------------------------------------------------------------------------  Chemistries   Recent Labs Lab 09/18/15 1346  09/20/15 0517  NA 139  < > 138  K 3.6  < > 3.8  CL 105  < > 108  CO2 28  < > 29  GLUCOSE 103*  < > 91  BUN 18  < > 11  CREATININE 0.52  < > 0.53  CALCIUM 9.0  < > 7.8*  MG  --   < > 2.0  AST 32  --   --   ALT 21  --   --   ALKPHOS 38  --   --   BILITOT 0.5  --   --   < > = values in this interval not displayed. ------------------------------------------------------------------------------------------------------------------  Cardiac Enzymes  Recent Labs Lab 09/18/15 1346  TROPONINI <0.03   ------------------------------------------------------------------------------------------------------------------  RADIOLOGY:  Dg Chest 2 View  09/19/2015  CLINICAL DATA:  Weakness for 3 days.  Hypertension and cough EXAM: CHEST  2 VIEW COMPARISON:  September 18, 2015 FINDINGS: There is no edema or consolidation. The heart size and pulmonary vascularity are normal. No adenopathy. There is thoracolumbar levoscoliosis. There is degenerative change in the thoracic spine. IMPRESSION: No edema or consolidation. Electronically Signed   By: Bretta Bang III M.D.   On: 09/19/2015 07:33  Dg Chest 2 View  09/18/2015  CLINICAL DATA:  Weakness and diarrhea EXAM: CHEST  2 VIEW COMPARISON:  04/30/2013 FINDINGS: Cardiac shadow is within normal limits. Aortic calcifications are again seen. Lungs are clear bilaterally. No sizable effusion or infiltrate is noted. No acute bony  abnormality is noted. IMPRESSION: No active cardiopulmonary disease. Electronically Signed   By: Alcide Clever M.D.   On: 09/18/2015 18:50   Ct Abdomen Pelvis W Contrast  09/18/2015  CLINICAL DATA:  Weakness and diarrhea starting last night. EXAM: CT ABDOMEN AND PELVIS WITH CONTRAST TECHNIQUE: Multidetector CT imaging of the abdomen and pelvis was performed using the standard protocol following bolus administration of intravenous contrast. CONTRAST:  OMNIPAQUE IOHEXOL 300 MG/ML  SOLN COMPARISON:  07/07/2013 FINDINGS: Motion degraded but overall diagnostic. Lower chest and abdominal wall:  Negative Hepatobiliary: No focal liver abnormality.Full gallbladder without visible stone or superimposed inflammation Pancreas: Unremarkable. Spleen: Unremarkable. Adrenals/Urinary Tract: Negative adrenals. No hydronephrosis or stone. 47 mm exophytic cyst from the lower pole left kidney versus lymphatic cysts. Unremarkable bladder. Reproductive:Minimal growth (5 mm) of a 27 mm right ovarian cyst since 2014. The appearance is still overall benign. Stomach/Bowel:  No obstruction. No appendicitis. Vascular/Lymphatic: No acute vascular abnormality. No mass or adenopathy. Peritoneal: No ascites or pneumoperitoneum. Musculoskeletal: No acute abnormalities. Osteopenia. Disc and facet degeneration with L4-5 anterolisthesis. IMPRESSION: No acute finding or significant change since 2014. Electronically Signed   By: Marnee Spring M.D.   On: 09/18/2015 21:37    EKG:   Orders placed or performed during the hospital encounter of 09/18/15  . ED EKG  . ED EKG    ASSESSMENT AND PLAN:   *Fever of unknown origin, active viral per ID.  Stool PCR for C. difficile was negative ,  Urine culture, blood cultures 2 were negative, influenza test was negative.. The patient is off all antibiotics at present and her fever is subsiding .   * Hypotension Hold her medication due to low side BP.  * Confusion. Resolving  * diarrhea,  patient still has some loose stools but isn't incontinent, C. difficile PCR was negative. Continue contact precautions   .*Hypokalemia.  resolved on KCl, f/u BMP.   *Hypomagnesemia. Mag iv, . Normal magnesium level today  *Thrombocytopenia, follow platelet count in the morning.  *Generalized weakness, physical therapist recommended skilled nursing facility placement likely early next week  All the records are reviewed and case discussed with Care Management/Social Workerr. Management plans discussed with the patient, family and they are in agreement.  CODE STATUS: full code.  TOTAL TIME TAKING CARE OF THIS PATIENT: 35 minutes.  Marland Kitchen   Katharina Caper M.D on 09/20/2015 at 2:28 PM  Between 7am to 6pm - Pager - [phone]  After 6pm go to [email or web address] - password [email or web address] [facility]  [facility]  Office  [phone]  CC: Primary care physician; Baruch Gouty, MD

## 2015-09-21 DIAGNOSIS — I959 Hypotension, unspecified: Secondary | ICD-10-CM

## 2015-09-21 DIAGNOSIS — R410 Disorientation, unspecified: Secondary | ICD-10-CM

## 2015-09-21 DIAGNOSIS — D696 Thrombocytopenia, unspecified: Secondary | ICD-10-CM

## 2015-09-21 DIAGNOSIS — R531 Weakness: Secondary | ICD-10-CM

## 2015-09-21 DIAGNOSIS — E876 Hypokalemia: Secondary | ICD-10-CM

## 2015-09-21 DIAGNOSIS — A084 Viral intestinal infection, unspecified: Secondary | ICD-10-CM | POA: Diagnosis not present

## 2015-09-21 LAB — PLATELET COUNT: Platelets: 146 10*3/uL — ABNORMAL LOW (ref 150–440)

## 2015-09-21 MED ORDER — LOPERAMIDE HCL 2 MG PO CAPS: 2.0000 mg | ORAL_CAPSULE | ORAL | Status: DC | PRN

## 2015-09-21 MED ORDER — ONDANSETRON 4 MG PO TBDP
4.0000 mg | ORAL_TABLET | Freq: Three times a day (TID) | ORAL | Status: DC | PRN
Start: 2015-09-21 — End: 2015-10-15

## 2015-09-21 NOTE — Clinical Social Work Placement (Signed)
   CLINICAL SOCIAL WORK PLACEMENT  NOTE  Date:  09/21/2015  Patient Details  Name: Felicia [name] MRN: [id number] Date of Birth: Aug 07, 1927  Clinical Social Work is seeking post-discharge placement for this patient at the Skilled  Nursing Facility level of care (*CSW will initial, date and re-position this form in  chart as items are completed):  Yes   Patient/family provided with [facility] Clinical Social Work Department's list of facilities offering this level of care within the geographic area requested by the patient (or if unable, by the patient's family).  Yes   Patient/family informed of their freedom to choose among providers that offer the needed level of care, that participate in Medicare, Medicaid or managed care program needed by the patient, have an available bed and are willing to accept the patient.  Yes   Patient/family informed of [facility]'s ownership interest in [facility] and [facility], as well as of the fact that they are under no obligation to receive care at these facilities.  PASRR submitted to EDS on       PASRR number received on       Existing PASRR number confirmed on 09/19/15     FL2 transmitted to all facilities in geographic area requested by pt/family on 09/19/15     FL2 transmitted to all facilities within larger geographic area on       Patient informed that his/her managed care company has contracts with or will negotiate with certain facilities, including the following:        Yes   Patient/family informed of bed offers received.  Patient chooses bed at  [facility] )     Physician recommends and patient chooses bed at      Patient to be transferred to  [facility]) on 09/21/15.  Patient to be transferred to facility by  [facility])     Patient family notified on 09/21/15 of transfer.  Name of family member notified:   Roanna Raider (Daughter) [phone])     PHYSICIAN       Additional Comment:     _______________________________________________ Starr Sinclair, LCSW 09/21/2015, 12:14 PM

## 2015-09-21 NOTE — Progress Notes (Signed)
Pt is discharged to [facility]. Report given to [facility], LPN. Pt's son to transfer pt. SW aware. Discharge packet given to son. Pt's son instructed to give the packet to pt's nurse at [facility], verbalized understanding.

## 2015-09-21 NOTE — Discharge Summary (Addendum)
[facility] - [place] at [facility]   PATIENT NAME: Felicia [name]    MR#:  [id number]  DATE OF BIRTH:  1928/06/16  DATE OF ADMISSION:  09/18/2015 ADMITTING PHYSICIAN: Altamese Dilling, MD  DATE OF DISCHARGE: 09/21/2015.  PRIMARY CARE PHYSICIAN: Baruch Gouty, MD     ADMISSION DIAGNOSIS:  Abdominal cramps [R10.9] Weakness [R53.1] Fever [R50.9] Fever, unspecified fever cause [R50.9] Diarrhea, unspecified type [R19.7]  DISCHARGE DIAGNOSIS:  Principal Problem:   Viral gastroenteritis Active Problems:   Hypotension   Acute delirium   Hypokalemia   Hypomagnesemia   Thrombocytopenia (HCC)   Generalized weakness   SECONDARY DIAGNOSIS:   Past Medical History  Diagnosis Date  . Rosacea   . Hyperlipidemia   . Hypertension   . Osteoporosis   . Dementia   . Vitamin D deficiency disease   . Urinary retention     chronic  . Condyloma acuminatum   . Thalamic infarction (HCC)   . Post-menopausal     .pro HOSPITAL COURSE:  The patient is an 82 year old Caucasian female with history of hyperlipidemia, hypertension, urinary retention, thalamic infarction who presents to the hospital with complaints of confusion and not feeling well, having diarrhea. In emergency room, she was noted to have fevers to 102.6. Her labs revealed hypokalemia, mild anemia and thrombocytopenia. Urinalysis was unremarkable, but elevated specific gravity, signifying dehydration. Patient's C. difficile testing was negative. The patient underwent CT of abdomen and pelvis with contrast revealing no acute findings. The patient was seen by infectious disease specialist, Dr. Sampson Goon, who felt that patient had viral gastroenteritis and recommended to keep her off antibiotic therapy. Patient's blood cultures, urine cultures, C. difficile, or rapid influenza testing was negative as mentioned above. This conservative therapy. Patient's condition improved, she was seen by physical therapist  who recommended skilled nursing facility placement. Discussion by problem #1. Acute viral gastroenteritis, improved with IV fluid administration and not on antibiotic therapy. The patient's blood cultures, urine cultures, stool PCR for C. difficile were negative, influenza test was negative. Patient's fever subsided. #2. Hypertension, resolved with IV fluid administration #3. Confusion, likely delirium due to acute infection, resolved. #4. Hypokalemia, resolved with IV fluids as well as oral medications. Follow as outpatient if patient continues to have intermittent diarrheal stool #5. Hypomagnesemia, supplemented intravenously. #6. Thrombocytopenia, follow as outpatient, likely infection related #6. Generalized weakness, patient was seen by physical therapist in skilled nursing facility placement was recommended for rehabilitation where patient will be discharged today.    DISCHARGE CONDITIONS:   Stable  CONSULTS OBTAINED:  Treatment Team:  Clydie Braun, MD  DRUG ALLERGIES:  No Known Allergies  DISCHARGE MEDICATIONS:   Current Discharge Medication List    START taking these medications   Details  loperamide (IMODIUM) 2 MG capsule Take 1 capsule (2 mg total) by mouth as needed for diarrhea or loose stools. Qty: 30 capsule, Refills: 0    ondansetron (ZOFRAN ODT) 4 MG disintegrating tablet Take 1 tablet (4 mg total) by mouth every 8 (eight) hours as needed for nausea or vomiting. Qty: 20 tablet, Refills: 0      CONTINUE these medications which have NOT CHANGED   Details  atorvastatin (LIPITOR) 10 MG tablet Take 10 mg by mouth at bedtime.    Cyanocobalamin (VITAMIN B12 PO) Take by mouth daily.    escitalopram (LEXAPRO) 5 MG tablet TAKE 1 TABLET BY MOUTH ONCE DAILY Qty: 30 tablet, Refills: 5    losartan (COZAAR) 25 MG tablet TAKE 1 TABLET BY  MOUTH ONCE DAILY Qty: 30 tablet, Refills: 5    rivastigmine (EXELON) 3 MG capsule Take 1 capsule (3 mg total) by mouth 2 (two)  times daily. Qty: 60 capsule, Refills: 5         DISCHARGE INSTRUCTIONS:    Patient is to follow-up with primary care physician as outpatient  If you experience worsening of your admission symptoms, develop shortness of breath, life threatening emergency, suicidal or homicidal thoughts you must seek medical attention immediately by calling 911 or calling your MD immediately  if symptoms less severe.  You Must read complete instructions/literature along with all the possible adverse reactions/side effects for all the Medicines you take and that have been prescribed to you. Take any new Medicines after you have completely understood and accept all the possible adverse reactions/side effects.   Please note  You were cared for by a hospitalist during your hospital stay. If you have any questions about your discharge medications or the care you received while you were in the hospital after you are discharged, you can call the unit and asked to speak with the hospitalist on call if the hospitalist that took care of you is not available. Once you are discharged, your primary care physician will handle any further medical issues. Please note that NO REFILLS for any discharge medications will be authorized once you are discharged, as it is imperative that you return to your primary care physician (or establish a relationship with a primary care physician if you do not have one) for your aftercare needs so that they can reassess your need for medications and monitor your lab values.    Today   CHIEF COMPLAINT:   Chief Complaint  Patient presents with  . Weakness  . Diarrhea    HISTORY OF PRESENT ILLNESS:  Felicia [name]  is a 82 y.o. female with a known history of hyperlipidemia, hypertension, urinary retention, thalamic infarction who presents to the hospital with complaints of confusion and not feeling well, having diarrhea. In emergency room, she was noted to have fevers to 102.6. Her labs  revealed hypokalemia, mild anemia and thrombocytopenia. Urinalysis was unremarkable, but elevated specific gravity, signifying dehydration. Patient's C. difficile testing was negative. The patient underwent CT of abdomen and pelvis with contrast revealing no acute findings. The patient was seen by infectious disease specialist, Dr. Sampson Goon, who felt that patient had viral gastroenteritis and recommended to keep her off antibiotic therapy. Patient's blood cultures, urine cultures, C. difficile, or rapid influenza testing was negative as mentioned above. This conservative therapy. Patient's condition improved, she was seen by physical therapist who recommended skilled nursing facility placement. Discussion by problem #1. Acute viral gastroenteritis, improved with IV fluid administration and not on antibiotic therapy. The patient's blood cultures, urine cultures, stool PCR for C. difficile were negative, influenza test was negative. Patient's fever subsided. #2. Hypertension, resolved with IV fluid administration #3. Confusion, likely delirium due to acute infection, resolved. #4. Hypokalemia, resolved with IV fluids as well as oral medications. Follow as outpatient if patient continues to have intermittent diarrheal stool #5. Hypomagnesemia, supplemented intravenously. #6. Thrombocytopenia, follow as outpatient, likely infection related #6. Generalized weakness, patient was seen by physical therapist in skilled nursing facility placement was recommended for rehabilitation where patient will be discharged today.     VITAL SIGNS:  Blood pressure 149/134, pulse 87, temperature 98.8 F (37.1 C), temperature source Oral, resp. rate 19, height 5\' 2"  (1.575 m), weight 57.335 kg (126 lb 6.4 oz), SpO2 97 %.  I/O:  No intake or output data in the 24 hours ending 09/21/15 0910  PHYSICAL EXAMINATION:  GENERAL:  82 y.o.-year-old patient lying in the bed with no acute distress.  EYES: Pupils equal, round,  reactive to light and accommodation. No scleral icterus. Extraocular muscles intact.  HEENT: Head atraumatic, normocephalic. Oropharynx and nasopharynx clear.  NECK:  Supple, no jugular venous distention. No thyroid enlargement, no tenderness.  LUNGS: Normal breath sounds bilaterally, no wheezing, rales,rhonchi or crepitation. No use of accessory muscles of respiration.  CARDIOVASCULAR: S1, S2 normal. No murmurs, rubs, or gallops.  ABDOMEN: Soft, non-tender, non-distended. Bowel sounds present. No organomegaly or mass.  EXTREMITIES: No pedal edema, cyanosis, or clubbing.  NEUROLOGIC: Cranial nerves II through XII are intact. Muscle strength 5/5 in all extremities. Sensation intact. Gait not checked.  PSYCHIATRIC: The patient is alert and oriented x 3.  SKIN: No obvious rash, lesion, or ulcer.   DATA REVIEW:   CBC  Recent Labs Lab 09/20/15 0517  WBC 3.6  HGB 10.9*  HCT 32.0*  PLT 129*    Chemistries   Recent Labs Lab 09/18/15 1346  09/20/15 0517  NA 139  < > 138  K 3.6  < > 3.8  CL 105  < > 108  CO2 28  < > 29  GLUCOSE 103*  < > 91  BUN 18  < > 11  CREATININE 0.52  < > 0.53  CALCIUM 9.0  < > 7.8*  MG  --   < > 2.0  AST 32  --   --   ALT 21  --   --   ALKPHOS 38  --   --   BILITOT 0.5  --   --   < > = values in this interval not displayed.  Cardiac Enzymes  Recent Labs Lab 09/18/15 1346  TROPONINI <0.03    Microbiology Results  Results for orders placed or performed during the hospital encounter of 09/18/15  Urine culture     Status: None   Collection Time: 09/18/15  5:22 PM  Result Value Ref Range Status   Specimen Description URINE, CLEAN CATCH  Final   Special Requests NONE  Final   Culture NO GROWTH 2 DAYS  Final   Report Status 09/20/2015 FINAL  Final  Blood culture (routine x 2)     Status: None (Preliminary result)   Collection Time: 09/18/15  6:13 PM  Result Value Ref Range Status   Specimen Description BLOOD RIGHT ASSIST CONTROL  Final    Special Requests BOTTLES DRAWN AEROBIC AND ANAEROBIC 7CC  Final   Culture NO GROWTH 2 DAYS  Final   Report Status PENDING  Incomplete  Rapid Influenza A&B Antigens ([facility] only)     Status: None   Collection Time: 09/18/15  6:13 PM  Result Value Ref Range Status   Influenza A ([facility]) NEGATIVE  Final   Influenza B ([facility]) NEGATIVE  Final  Blood culture (routine x 2)     Status: None (Preliminary result)   Collection Time: 09/18/15  6:14 PM  Result Value Ref Range Status   Specimen Description BLOOD LEFT WRIST  Final   Special Requests BOTTLES DRAWN AEROBIC AND ANAEROBIC 7CC  Final   Culture NO GROWTH 2 DAYS  Final   Report Status PENDING  Incomplete  C difficile quick scan w PCR reflex     Status: None   Collection Time: 09/20/15 10:51 AM  Result Value Ref Range Status   C Diff antigen NEGATIVE  NEGATIVE Final   C Diff toxin NEGATIVE NEGATIVE Final   C Diff interpretation Negative for C. difficile  Final    RADIOLOGY:  No results found.  EKG:   Orders placed or performed during the hospital encounter of 09/18/15  . ED EKG  . ED EKG      Management plans discussed with the patient, family and they are in agreement.  CODE STATUS:     Code Status Orders        Start     Ordered   09/18/15 2001  Full code   Continuous     09/18/15 2000    Code Status History    Date Active Date Inactive Code Status Order ID Comments User Context   This patient has a current code status but no historical code status.      TOTAL TIME TAKING CARE OF THIS PATIENT: 40 minutes.    Katharina Caper M.D on 09/21/2015 at 9:10 AM  Between 7am to 6pm - Pager - [phone]  After 6pm go to [email or web address] - password [email or web address] [facility]  [place]  [facility]  Office  [phone]  CC: Primary care physician; Baruch Gouty, MD

## 2015-09-21 NOTE — Progress Notes (Addendum)
Patient has orders for d/c to SNF, patient will be discharging to [facility]. Dr Winona Legato completed and signed d/c summary and order. All d/c information faxed to facility and confirmed by [facility]. Victorino Dike Provided CSW with contact number and room number. Patient leaving by EMS. SW completed EMS packet and placed hard scripts and DNR form in packet. RN Sheryl (Ext [phone]) was notified that packet was ready and placed next to patients chart. Nurse was provided Rm # [address], contact # for report is [phone]. Patient daughter Roanna Raider is aware of d/c and copay at [facility] starting day 1. Daughter is agreeable and has dropped check off at facility. No further identified needs.   Sherryl Manges, BSW, MSW, LCSWA Clinical Social Work Dept [phone]

## 2015-09-21 NOTE — Progress Notes (Signed)
Notified Lelon Mast SW that pt's son requests to transfer pt himself to [facility]. SW verbalized OK for son to transfer Pt.

## 2015-09-23 ENCOUNTER — Encounter: Payer: Self-pay | Admitting: Internal Medicine

## 2015-09-23 ENCOUNTER — Non-Acute Institutional Stay (SKILLED_NURSING_FACILITY): Payer: Medicare Other | Admitting: Internal Medicine

## 2015-09-23 DIAGNOSIS — E538 Deficiency of other specified B group vitamins: Secondary | ICD-10-CM

## 2015-09-23 DIAGNOSIS — E785 Hyperlipidemia, unspecified: Secondary | ICD-10-CM

## 2015-09-23 DIAGNOSIS — D649 Anemia, unspecified: Secondary | ICD-10-CM

## 2015-09-23 DIAGNOSIS — D696 Thrombocytopenia, unspecified: Secondary | ICD-10-CM | POA: Diagnosis not present

## 2015-09-23 DIAGNOSIS — A084 Viral intestinal infection, unspecified: Secondary | ICD-10-CM | POA: Diagnosis not present

## 2015-09-23 DIAGNOSIS — F0390 Unspecified dementia without behavioral disturbance: Secondary | ICD-10-CM | POA: Diagnosis not present

## 2015-09-23 DIAGNOSIS — F329 Major depressive disorder, single episode, unspecified: Secondary | ICD-10-CM | POA: Diagnosis not present

## 2015-09-23 DIAGNOSIS — F32A Depression, unspecified: Secondary | ICD-10-CM

## 2015-09-23 DIAGNOSIS — I10 Essential (primary) hypertension: Secondary | ICD-10-CM

## 2015-09-23 DIAGNOSIS — R5381 Other malaise: Secondary | ICD-10-CM | POA: Diagnosis not present

## 2015-09-23 LAB — CULTURE, BLOOD (ROUTINE X 2)
CULTURE: NO GROWTH
Culture: NO GROWTH

## 2015-09-23 NOTE — Progress Notes (Signed)
Patient ID: Felicia [name], female   DOB: [birth year], 82 y.o.   MRN: [id number]    LOCATION: [facility]  PCP: Felicia Gouty, MD   Code Status: DNR  Goals of care: Advanced Directive information Advanced Directives 09/23/2015  Does patient have an advance directive? Yes  Type of Advance Directive Out of facility DNR (pink MOST or yellow form)  Does patient want to make changes to advanced directive? No - Patient declined  Copy of advanced directive(s) in chart? Yes  Would patient like information on creating an advanced directive? -       Extended Emergency Contact Information Primary Emergency Contact: Felicia,[name] Address: [address]          [place], [place] [address] [place] Home Phone: [phone] Relation: Daughter Secondary Emergency Contact: Felicia,[name] Address: [address]  [place] Home Phone: [phone] Mobile Phone: [phone] Relation: Son   No Known Allergies  Chief Complaint  Patient presents with  . New Admit To SNF    New Admission     HPI:  Patient is a 82 y.o. female seen today for short term rehabilitation post hospital admission from 09/14/15-09/21/15 with viral gastroenteritis with hypotension and electrolyte imbalance. She required iv fluids and lyte repletion. ID and GI were consulted and infectious etiology was ruled out. She was placed on loperamide for diarrhea. She is seen in her room today. She is pleasantly confused and has PMH of dementia, thalamic infarct, osteoporosis, HLD among others. She is seen in her room today and denies any concerns. No new concern from nursing staff. No further loose stool.  Review of Systems:  Constitutional: Negative for fever, chills, diaphoresis.  HENT: Negative for headache, congestion, nasal discharge Eyes: Negative for blurred vision  Respiratory: Negative for cough, shortness of breath and wheezing.   Cardiovascular: Negative for chest pain, palpitations.    Gastrointestinal: Negative for heartburn, nausea, vomiting, abdominal pain. Genitourinary: Negative for dysuria Musculoskeletal: Negative for fall in the facility Skin: Negative for itching, rash.  Neurological: Negative for dizziness. Psychiatric/Behavioral: Negative for depression   Past Medical History  Diagnosis Date  . Rosacea   . Hyperlipidemia   . Hypertension   . Osteoporosis   . Dementia   . Vitamin D deficiency disease   . Urinary retention     chronic  . Condyloma acuminatum   . Thalamic infarction (HCC)   . Post-menopausal    Past Surgical History  Procedure Laterality Date  . Suprapubic catheter placement  Nov. 2014   Social History:   reports that she has never smoked. She has never used smokeless tobacco. She reports that she does not drink alcohol or use illicit drugs.  Family History  Problem Relation Age of Onset  . CAD Father     Medications:   Medication List       This list is accurate as of: 09/23/15  9:52 AM.  Always use your most recent med list.               atorvastatin 10 MG tablet  Commonly known as:  LIPITOR  Take 10 mg by mouth at bedtime.     escitalopram 5 MG tablet  Commonly known as:  LEXAPRO  TAKE 1 TABLET BY MOUTH ONCE DAILY     loperamide 2 MG capsule  Commonly known as:  IMODIUM  Take 1 capsule (2 mg total) by mouth as needed for diarrhea or loose stools.  losartan 25 MG tablet  Commonly known as:  COZAAR  TAKE 1 TABLET BY MOUTH ONCE DAILY     ondansetron 4 MG disintegrating tablet  Commonly known as:  ZOFRAN ODT  Take 1 tablet (4 mg total) by mouth every 8 (eight) hours as needed for nausea or vomiting.     rivastigmine 3 MG capsule  Commonly known as:  EXELON  Take 1 capsule (3 mg total) by mouth 2 (two) times daily.     VITAMIN B12 PO  Take by mouth daily.        Immunizations: Immunization History  Administered Date(s) Administered  . Influenza,inj,Quad PF,36+ Mos 09/21/2015  . PPD Test  09/21/2015  . Pneumococcal Conjugate-13 02/21/2014  . Pneumococcal Polysaccharide-23 09/20/2015  . Td 07/07/2001     Physical Exam: Filed Vitals:   09/23/15 0938  BP: 124/70  Pulse: 66  Temp: 97.4 F (36.3 C)  TempSrc: Oral  Resp: 20  Height:  (1.575 m)  Weight: 126 lb (57.153 kg)  SpO2: 94%   Body mass index is 23.04 kg/(m^2).  General- elderly female, well built, in no acute distress Head- normocephalic, atraumatic Nose- no maxillary or frontal sinus tenderness, no nasal discharge Throat- moist mucus membrane Eyes- PERRLA, EOMI, no pallor, no icterus Neck- no cervical lymphadenopathy Cardiovascular- normal s1,s2, no murmurs, no leg edema Respiratory- bilateral clear to auscultation, no wheeze, no rhonchi Abdomen- bowel sounds present, soft, non tender Musculoskeletal- able to move all 4 extremities, generalized weakness +, on wheelchair Neurological- alert and oriented to person and place Skin- warm and dry Psychiatry- normal mood and affect    Labs reviewed: Basic Metabolic Panel:  Recent Labs  69/62/95 1346 09/19/15 0456 09/20/15 09/20/15 0517  NA 139 139 138 138  K 3.6 3.4*  --  3.8  CL 105 110  --  108  CO2 28 23  --  29  GLUCOSE 103* 91  --  91  BUN CREATININE 0.52 0.56 0.5 0.53  CALCIUM 9.0 7.8*  --  7.8*  MG  --  1.6*  --  2.0   Liver Function Tests:  Recent Labs  03/21/15 1050 09/18/15 1346  AST 19 32  ALT 8 21  ALKPHOS 51 38  BILITOT 0.6 0.5  PROT 6.2 6.1*  ALBUMIN 4.2 3.8    Recent Labs  09/18/15 1346  LIPASE 25   No results for input(s): AMMONIA in the last 8760 hours. CBC:  Recent Labs  09/18/15 1346 09/18/15 1918 09/19/15 0456 09/20/15 09/20/15 0517 09/21/15 0930  WBC 8.3 6.3 6.7 3.6 3.6  --   NEUTROABS 7.3* 5.4  --   --   --   --   HGB 12.3 10.8* 11.7*  --  10.9*  --   HCT 35.8 31.5* 35.1  --  32.0*  --   MCV 89.3 90.6 93.5  --  91.6  --   PLT 171 153 144*  --  129* 146*   Cardiac  Enzymes:  Recent Labs  09/18/15 1346  TROPONINI <0.03   BNP: Invalid input(s): POCBNP CBG: No results for input(s): GLUCAP in the last 8760 hours.  Radiological Exams: Dg Chest 2 View  09/19/2015  CLINICAL DATA:  Weakness for 3 days.  Hypertension and cough EXAM: CHEST  2 VIEW COMPARISON:  September 18, 2015 FINDINGS: There is no edema or consolidation. The heart size and pulmonary vascularity are normal. No adenopathy. There is thoracolumbar levoscoliosis. There is degenerative change in the  thoracic spine. IMPRESSION: No edema or consolidation. Electronically Signed   By: Bretta Bang III M.D.   On: 09/19/2015 07:33   Dg Chest 2 View  09/18/2015  CLINICAL DATA:  Weakness and diarrhea EXAM: CHEST  2 VIEW COMPARISON:  04/30/2013 FINDINGS: Cardiac shadow is within normal limits. Aortic calcifications are again seen. Lungs are clear bilaterally. No sizable effusion or infiltrate is noted. No acute bony abnormality is noted. IMPRESSION: No active cardiopulmonary disease. Electronically Signed   By: Alcide Clever M.D.   On: 09/18/2015 18:50   Ct Abdomen Pelvis W Contrast  09/18/2015  CLINICAL DATA:  Weakness and diarrhea starting last night. EXAM: CT ABDOMEN AND PELVIS WITH CONTRAST TECHNIQUE: Multidetector CT imaging of the abdomen and pelvis was performed using the standard protocol following bolus administration of intravenous contrast. CONTRAST:  OMNIPAQUE IOHEXOL 300 MG/ML  SOLN COMPARISON:  07/07/2013 FINDINGS: Motion degraded but overall diagnostic. Lower chest and abdominal wall:  Negative Hepatobiliary: No focal liver abnormality.Full gallbladder without visible stone or superimposed inflammation Pancreas: Unremarkable. Spleen: Unremarkable. Adrenals/Urinary Tract: Negative adrenals. No hydronephrosis or stone. 47 mm exophytic cyst from the lower pole left kidney versus lymphatic cysts. Unremarkable bladder. Reproductive:Minimal growth (5 mm) of a 27 mm right ovarian cyst since  2014. The appearance is still overall benign. Stomach/Bowel:  No obstruction. No appendicitis. Vascular/Lymphatic: No acute vascular abnormality. No mass or adenopathy. Peritoneal: No ascites or pneumoperitoneum. Musculoskeletal: No acute abnormalities. Osteopenia. Disc and facet degeneration with L4-5 anterolisthesis. IMPRESSION: No acute finding or significant change since 2014. Electronically Signed   By: Marnee Spring M.D.   On: 09/18/2015 21:37    Assessment/Plan  Physical deconditioning Will have patient work with PT/OT as tolerated to regain strength and restore function.  Fall precautions are in place.  Viral gastroenteritis Has resolved. No further loose stool. Monitor clinically, hydration to be maintained  Anemia Has hx of b12 def. Continue b12 supplement. Check cbc  Thrombocytopenia No bleed reported. Easy bruising. Check cbc  HTN Stable, monitor bp daily, continue cozaar 25 mg daily  Dementia without behavioral disturbance Likely with vascular component. Continue exelon patch  Chronic depression Stable mood, continue lexapro  HLD Continue lipitor, no changes made  b12 def Continue b12 supplement  Goals of care: short term rehabilitation   Labs/tests ordered: cbc, bmp 09/29/15  Family/ staff Communication: reviewed care plan with patient and nursing supervisor    Oneal Grout, MD Internal Medicine [facility] [address] [place], [place] [address] Cell Phone (Monday-Friday 8 am - 5 pm): [phone] On Call: [phone] and follow prompts after 5 pm and on weekends Office Phone: [phone] Office Fax: [phone]

## 2015-10-02 ENCOUNTER — Non-Acute Institutional Stay (SKILLED_NURSING_FACILITY): Payer: Medicare Other | Admitting: Family

## 2015-10-02 DIAGNOSIS — E538 Deficiency of other specified B group vitamins: Secondary | ICD-10-CM | POA: Diagnosis not present

## 2015-10-02 DIAGNOSIS — E785 Hyperlipidemia, unspecified: Secondary | ICD-10-CM | POA: Diagnosis not present

## 2015-10-02 DIAGNOSIS — I10 Essential (primary) hypertension: Secondary | ICD-10-CM

## 2015-10-02 DIAGNOSIS — F0390 Unspecified dementia without behavioral disturbance: Secondary | ICD-10-CM

## 2015-10-02 DIAGNOSIS — R531 Weakness: Secondary | ICD-10-CM

## 2015-10-02 NOTE — Progress Notes (Signed)
Patient ID: Felicia [name], female   DOB: 08-21-1927, 82 y.o.   MRN: [id number]  Location:  [facility]   Place of Service:  SNF (31)  Provider:Mahima Glade Lloyd, MD   PCP: Baruch Gouty, MD Patient Care Team: Kerman Passey, MD as PCP - General (Family Medicine) Jerrell Mylar, MD (Obstetrics and Gynecology)  Extended Emergency Contact Information Primary Emergency Contact: Rubiano,[name] Address: [address]          [place], [place] [address] [place] Home Phone: [phone] Relation: Daughter Secondary Emergency Contact: Farace,[name] Address: [address]  [place] Home Phone: [phone] Mobile Phone: [phone] Relation: Son  Code Status: DNR Goals of care:  Advanced Directive information Advanced Directives 09/23/2015  Does patient have an advance directive? Yes  Type of Advance Directive Out of facility DNR (pink MOST or yellow form)  Does patient want to make changes to advanced directive? No - Patient declined  Copy of advanced directive(s) in chart? Yes  Would patient like information on creating an advanced directive? -     No Known Allergies  Chief Complaint  Patient presents with  . Discharge Note    HPI:  82 y.o. female seen today at  [facility] for discharge home.She is here for short term rehabilitation post hospital admission from 09/14/15-09/21/15 with viral gastroenteritis with hypotension and electrolyte imbalance. She required iv fluids and lyte repletion. ID and GI were consulted and infectious etiology was ruled out. She was placed on loperamide for diarrhea. She is seen in her room today with husband who is a resident too at bedside. She has a medical history of HTN,dementia, thalamic infarct, osteoporosis, HLD among others. Her generalized weakness has improved with PT/OT now stable for discharge home. She will be discharged home with PT/OT for ROM, exercise and Muscle  strengthening. She will also require HH AID to assist with ADL's. No DME required. History limited due to presence of dementia.        Past Medical History  Diagnosis Date  . Rosacea   . Hyperlipidemia   . Hypertension   . Osteoporosis   . Dementia   . Vitamin D deficiency disease   . Urinary retention     chronic  . Condyloma acuminatum   . Thalamic infarction (HCC)   . Post-menopausal     Past Surgical History  Procedure Laterality Date  . Suprapubic catheter placement  Nov. 2014      reports that she has never smoked. She has never used smokeless tobacco. She reports that she does not drink alcohol or use illicit drugs. Social History   Social History  . Marital Status: Married    Spouse Name: N/A  . Number of Children: N/A  . Years of Education: N/A   Occupational History  . Not on file.   Social History Main Topics  . Smoking status: Never Smoker   . Smokeless tobacco: Never Used  . Alcohol Use: No  . Drug Use: No  . Sexual Activity: Not on file   Other Topics Concern  . Not on file   Social History Narrative   Functional Status Survey:    No Known Allergies  Pertinent  Health Maintenance Due  Topic Date Due  . DEXA SCAN  09/13/1994  . INFLUENZA VACCINE  03/02/2016  . PNA vac Low Risk Adult  Completed    Medications:   Medication List  This list is accurate as of: 10/02/15  4:35 PM.  Always use your most recent med list.               atorvastatin 10 MG tablet  Commonly known as:  LIPITOR  Take 10 mg by mouth at bedtime.     escitalopram 5 MG tablet  Commonly known as:  LEXAPRO  TAKE 1 TABLET BY MOUTH ONCE DAILY     loperamide 2 MG capsule  Commonly known as:  IMODIUM  Take 1 capsule (2 mg total) by mouth as needed for diarrhea or loose stools.     losartan 25 MG tablet  Commonly known as:  COZAAR  TAKE 1 TABLET BY MOUTH ONCE DAILY     ondansetron 4 MG disintegrating tablet  Commonly known as:  ZOFRAN ODT  Take 1 tablet  (4 mg total) by mouth every 8 (eight) hours as needed for nausea or vomiting.     rivastigmine 3 MG capsule  Commonly known as:  EXELON  Take 1 capsule (3 mg total) by mouth 2 (two) times daily.     VITAMIN B12 PO  Take by mouth daily.        Review of Systems  Unable to perform ROS: Dementia    Filed Vitals:   10/02/15 1628  BP: 128/69  Pulse: 59  Temp: 98.2 F (36.8 C)  Resp: 18  Weight: 113 lb 3.2 oz (51.347 kg)  SpO2: 96%   Body mass index is 20.7 kg/(m^2). Physical Exam  Constitutional: She appears well-developed and well-nourished.  Elderly in no acute distress.   HENT:  Head: Normocephalic and atraumatic.  Right Ear: External ear normal.  Left Ear: External ear normal.  Mouth/Throat: Oropharynx is clear and moist. No oropharyngeal exudate.  Eyes: Conjunctivae and EOM are normal. Pupils are equal, round, and reactive to light. Right eye exhibits no discharge. Left eye exhibits no discharge. No scleral icterus.  Neck: Normal range of motion. No JVD present. No thyromegaly present.  Cardiovascular: Normal rate, regular rhythm and intact distal pulses.  Exam reveals no gallop and no friction rub.   No murmur heard. Pulmonary/Chest: Effort normal and breath sounds normal. No respiratory distress. She has no wheezes. She has no rales.  Abdominal: Soft. Bowel sounds are normal. She exhibits no distension and no mass. There is no tenderness. There is no rebound and no guarding.  Musculoskeletal: Normal range of motion. She exhibits no edema or tenderness.  Lymphadenopathy:    She has no cervical adenopathy.  Neurological: She is alert.  Pleasantly confused.  Skin: Skin is warm and dry. No rash noted. No erythema. No pallor.  Psychiatric: She has a normal mood and affect.    Labs reviewed: Basic Metabolic Panel:  Recent Labs  16/10/96 1346 09/19/15 0456 09/20/15 09/20/15 0517  NA 139 139 138 138  K 3.6 3.4*  --  3.8  CL 105 110  --  108  CO2 28 23  --  29    GLUCOSE 103* 91  --  91  BUN CREATININE 0.52 0.56 0.5 0.53  CALCIUM 9.0 7.8*  --  7.8*  MG  --  1.6*  --  2.0   Liver Function Tests:  Recent Labs  03/21/15 1050 09/18/15 1346  AST 19 32  ALT 8 21  ALKPHOS 51 38  BILITOT 0.6 0.5  PROT 6.2 6.1*  ALBUMIN 4.2 3.8    Recent Labs  09/18/15 1346  LIPASE 25  No results for input(s): AMMONIA in the last 8760 hours. CBC:  Recent Labs  09/18/15 1346 09/18/15 1918 09/19/15 0456 09/20/15 09/20/15 0517 09/21/15 0930  WBC 8.3 6.3 6.7 3.6 3.6  --   NEUTROABS 7.3* 5.4  --   --   --   --   HGB 12.3 10.8* 11.7*  --  10.9*  --   HCT 35.8 31.5* 35.1  --  32.0*  --   MCV 89.3 90.6 93.5  --  91.6  --   PLT 171 153 144*  --  129* 146*   Cardiac Enzymes:  Recent Labs  09/18/15 1346  TROPONINI <0.03   BNP: Invalid input(s): POCBNP CBG: No results for input(s): GLUCAP in the last 8760 hours.  Procedures and Imaging Studies During Stay: Dg Chest 2 View  09/19/2015  CLINICAL DATA:  Weakness for 3 days.  Hypertension and cough EXAM: CHEST  2 VIEW COMPARISON:  September 18, 2015 FINDINGS: There is no edema or consolidation. The heart size and pulmonary vascularity are normal. No adenopathy. There is thoracolumbar levoscoliosis. There is degenerative change in the thoracic spine. IMPRESSION: No edema or consolidation. Electronically Signed   By: Bretta Bang III M.D.   On: 09/19/2015 07:33   Dg Chest 2 View  09/18/2015  CLINICAL DATA:  Weakness and diarrhea EXAM: CHEST  2 VIEW COMPARISON:  04/30/2013 FINDINGS: Cardiac shadow is within normal limits. Aortic calcifications are again seen. Lungs are clear bilaterally. No sizable effusion or infiltrate is noted. No acute bony abnormality is noted. IMPRESSION: No active cardiopulmonary disease. Electronically Signed   By: Alcide Clever M.D.   On: 09/18/2015 18:50   Ct Abdomen Pelvis W Contrast  09/18/2015  CLINICAL DATA:  Weakness and diarrhea starting last night. EXAM:  CT ABDOMEN AND PELVIS WITH CONTRAST TECHNIQUE: Multidetector CT imaging of the abdomen and pelvis was performed using the standard protocol following bolus administration of intravenous contrast. CONTRAST:  OMNIPAQUE IOHEXOL 300 MG/ML  SOLN COMPARISON:  07/07/2013 FINDINGS: Motion degraded but overall diagnostic. Lower chest and abdominal wall:  Negative Hepatobiliary: No focal liver abnormality.Full gallbladder without visible stone or superimposed inflammation Pancreas: Unremarkable. Spleen: Unremarkable. Adrenals/Urinary Tract: Negative adrenals. No hydronephrosis or stone. 47 mm exophytic cyst from the lower pole left kidney versus lymphatic cysts. Unremarkable bladder. Reproductive:Minimal growth (5 mm) of a 27 mm right ovarian cyst since 2014. The appearance is still overall benign. Stomach/Bowel:  No obstruction. No appendicitis. Vascular/Lymphatic: No acute vascular abnormality. No mass or adenopathy. Peritoneal: No ascites or pneumoperitoneum. Musculoskeletal: No acute abnormalities. Osteopenia. Disc and facet degeneration with L4-5 anterolisthesis. IMPRESSION: No acute finding or significant change since 2014. Electronically Signed   By: Marnee Spring M.D.   On: 09/18/2015 21:37    Assessment/Plan:   1. Dementia, without behavioral disturbance No new behaviors reported. Continue on Rivastigmine 3 mg Capsule twice daily. Will discharge home with [facility] AID to assist with ADL's.  2. Essential hypertension B/P stable. Continue on Losartan 25 mg Tablet daily. PCP to monitor BMP  3. Vitamin B12 deficiency Continue on Vit B12 supplements. PCP to monitor vit B12 level  4. Hyperlipidemia Continue on Atorvastatin 10 mg Tablet. Monitor lipid panel   5. Generalized weakness Has improved. Will discharge home with PT/OT for ROM, exercise and Muscle strengthening. She will also require HH AID to assist with ADL's.   Patient is being discharged with the following home health services:    PT/OT  for ROM, exercise and Muscle strengthening.  HH AID to assist with ADL's.   Patient is being discharged with the following durable medical equipment:   No DME required.   Rx written for one month supply.   Patient has been advised to f/u with their PCP in 1-2 weeks to bring them up to date on their rehab stay.  Social services at facility was responsible for arranging this appointment.  Pt was provided with a 30 day supply of prescriptions for medications and refills must be obtained from their PCP.  For controlled substances, a more limited supply may be provided adequate until PCP appointment only.  Future labs/tests needed:  CBC, BMP, Lipid panel follow up with PCP

## 2015-10-15 ENCOUNTER — Encounter: Payer: Self-pay | Admitting: Family Medicine

## 2015-10-15 ENCOUNTER — Ambulatory Visit (INDEPENDENT_AMBULATORY_CARE_PROVIDER_SITE_OTHER): Payer: Medicare Other | Admitting: Family Medicine

## 2015-10-15 VITALS — BP 137/83 | HR 70 | Temp 97.7°F | Ht 58.5 in | Wt 109.0 lb

## 2015-10-15 DIAGNOSIS — F0390 Unspecified dementia without behavioral disturbance: Secondary | ICD-10-CM | POA: Diagnosis not present

## 2015-10-15 DIAGNOSIS — D696 Thrombocytopenia, unspecified: Secondary | ICD-10-CM | POA: Diagnosis not present

## 2015-10-15 DIAGNOSIS — Z66 Do not resuscitate: Secondary | ICD-10-CM

## 2015-10-15 DIAGNOSIS — R531 Weakness: Secondary | ICD-10-CM

## 2015-10-15 DIAGNOSIS — R634 Abnormal weight loss: Secondary | ICD-10-CM | POA: Diagnosis not present

## 2015-10-15 DIAGNOSIS — E785 Hyperlipidemia, unspecified: Secondary | ICD-10-CM | POA: Diagnosis not present

## 2015-10-15 DIAGNOSIS — I10 Essential (primary) hypertension: Secondary | ICD-10-CM | POA: Diagnosis not present

## 2015-10-15 DIAGNOSIS — E559 Vitamin D deficiency, unspecified: Secondary | ICD-10-CM

## 2015-10-15 NOTE — Progress Notes (Signed)
BP 137/83 mmHg  Pulse 70  Temp(Src) 97.7 F (36.5 C)  Ht 4' 10.5" (1.486 m)  Wt 109 lb (49.442 kg)  BMI 22.39 kg/m2  SpO2 98%   Subjective:    Patient ID: Felicia [name], female    DOB: 19-Nov-1927, 82 y.o.   MRN: [id number]  HPI: Felicia [name] is a 82 y.o. female  Chief Complaint  Patient presents with  . Follow-up    she had the flu and was in rehab; she needed to be seen for home health therapy  . Other    they want to discuss her meds, she isn't taking any of them right now. She either refuses or spits them back out.    Patient was in the hospital with flu and GI bug; she was in the hospital and then rehab and had to go home; not participating therapy; returned home on March 2nd; she is not doing any sort of therapy; she does not feel too strong; able to walk on her own; does not use cane or walker; no falls since leaving the nursing; could use with laundry; cannot do her own; no problem feeding herself; needs help with showering, able to stand; able to toilet okay; no incontinence; needs order now for home health aide to assist with ADLs  Per the discharge from [facility]: Generalized weakness Has improved. Will discharge home with PT/OT for ROM, exercise and Muscle strengthening. She will also require HH AID to assist with ADL's.   She needs to have her medicine regimen looked at; caregiver says that she had been taking everything in a pill pack; [facility] put in packs for her; 2-3 months before she went in the hospital, she would spit them out or refuse to take them; if she took the meds, she would get deathly nauseated and would not eat; caregiver quit giving her the medicine and her appetite dramatically improved; she had lost weight and then rebounded back in the nursing home; they were giving her the meds again and then her appetite got worse; something in the medicine makes her nauseated; other then when in hospital and rehab, she has not been on any meds for about 3  months; caregiver cooks them a huge meal and she has a big meal in the middle of the day; the daughter (not here today) was supposed to meet yesterday with the social worker; they don't want that; while they were in the nursing home, caregiver and son removed all the clutter, safe to walk around the bed, rugs and all that stuff; not seeing a neurologist; no outbursts of anger or psychosis; we talked about her weight loss again, and caregiver says she did eat anything while she was in the nursing home; that's when they started giving her the medicine again; caregiver believes she has come up in weight the last few days; good appetite at home now; both husband and wife came home with significant weight loss  Platelets low; calcium low, anemic per recent labs reviewed from hospital  Relevant past medical, surgical, family and social history reviewed and updated as indicated. Interim medical history since our last visit reviewed. Allergies and medications reviewed and updated.  Review of Systems  Constitutional: Positive for unexpected weight change.  HENT: Negative for nosebleeds.   Gastrointestinal: Negative for blood in stool.  Genitourinary: Negative for hematuria.   No pain anywhere; breathing okay; no numbness or tingling anywhere; moving bowels and urine okay Per HPI unless specifically indicated  above     Objective:    BP 137/83 mmHg  Pulse 70  Temp(Src) 97.7 F (36.5 C)  Ht 4' 10.5" (1.486 m)  Wt 109 lb (49.442 kg)  BMI 22.39 kg/m2  SpO2 98%  Wt Readings from Last 3 Encounters:  10/15/15 109 lb (49.442 kg)  10/02/15 113 lb 3.2 oz (51.347 kg)  09/23/15 126 lb (57.153 kg)    Physical Exam  Constitutional:  Elderly female no distress  Cardiovascular: Normal rate and regular rhythm.   Pulmonary/Chest: Effort normal and breath sounds normal.  Abdominal: She exhibits no distension.  Neurological: She displays no tremor.  Skin: No pallor.  Psychiatric: Her mood appears not  anxious. Cognition and memory are impaired. She does not exhibit a depressed mood. She exhibits abnormal recent memory.   memory testing: Year: uh... 2012 Month: January Registration: Felicia [name], unable to name address or street, Felicia [name] [facility] -14-20, -11 Dec, Nov, Oct, Nov, no I already did that, that's a little tough Recall:  Felicia [name], ... No more      Assessment & Plan:   Problem List Items Addressed This Visit      Cardiovascular and Mediastinum   Hypertension    Controlled today on no medicine        Nervous and Auditory   Dementia    Unfortunately, the medicine seems to be causing GI intolerance; refer to neurologist        Other   Hyperlipidemia    Check today      Relevant Orders   Lipid Panel w/o Chol/HDL Ratio (Completed)   Vitamin D deficiency disease    Recheck today      Relevant Orders   VITAMIN D 25 Hydroxy (Vit-D Deficiency, Fractures) (Completed)   Thrombocytopenia (HCC)    Recheck today      Relevant Orders   CBC with Differential/Platelet (Completed)   DNAR (do not attempt resuscitation)   Weakness - Primary   Relevant Orders   Ambulatory referral to Home Health   Abnormal weight loss   Relevant Orders   Comprehensive metabolic panel (Completed)   TSH (Completed)      Follow up plan: Return 3-4 weeks, for follow-up.  An after-visit summary was printed and given to the patient at check-out.  Please see the patient instructions which may contain other information and recommendations beyond what is mentioned above in the assessment and plan. Orders Placed This Encounter  Procedures  . CBC with Differential/Platelet  . Comprehensive metabolic panel  . TSH  . VITAMIN D 25 Hydroxy (Vit-D Deficiency, Fractures)  . Lipid Panel w/o Chol/HDL Ratio  . Ambulatory referral to Home Health

## 2015-10-15 NOTE — Assessment & Plan Note (Signed)
Recheck today. 

## 2015-10-15 NOTE — Patient Instructions (Addendum)
We'll check labs today Weigh her three times a week and let us know numbersK[place] in 2 weeks I do suggest having her see Dr. Hollace HaywardPlonk (a board certified geriatrician) for her ongoing care in the next 3-4 weeks Please schedule an appointment with him if you decide that is right for you If not, please return here in the 3-4 weeks

## 2015-10-15 NOTE — Assessment & Plan Note (Signed)
Unfortunately, the medicine seems to be causing GI intolerance; refer to neurologist

## 2015-10-15 NOTE — Assessment & Plan Note (Signed)
Controlled today on no medicine

## 2015-10-15 NOTE — Assessment & Plan Note (Signed)
Check today 

## 2015-10-16 ENCOUNTER — Encounter: Payer: Self-pay | Admitting: Family Medicine

## 2015-10-16 ENCOUNTER — Telehealth: Payer: Self-pay | Admitting: Family Medicine

## 2015-10-16 LAB — CBC WITH DIFFERENTIAL/PLATELET
BASOS: 0 %
Basophils Absolute: 0 10*3/uL (ref 0.0–0.2)
EOS (ABSOLUTE): 0.1 10*3/uL (ref 0.0–0.4)
EOS: 2 %
HEMATOCRIT: 43.7 % (ref 34.0–46.6)
Hemoglobin: 14.2 g/dL (ref 11.1–15.9)
Immature Grans (Abs): 0 10*3/uL (ref 0.0–0.1)
Immature Granulocytes: 0 %
LYMPHS ABS: 1.1 10*3/uL (ref 0.7–3.1)
Lymphs: 25 %
MCH: 30 pg (ref 26.6–33.0)
MCHC: 32.5 g/dL (ref 31.5–35.7)
MCV: 92 fL (ref 79–97)
MONOS ABS: 0.6 10*3/uL (ref 0.1–0.9)
Monocytes: 13 %
NEUTROS ABS: 2.7 10*3/uL (ref 1.4–7.0)
Neutrophils: 60 %
Platelets: 249 10*3/uL (ref 150–379)
RBC: 4.74 x10E6/uL (ref 3.77–5.28)
RDW: 13 % (ref 12.3–15.4)
WBC: 4.5 10*3/uL (ref 3.4–10.8)

## 2015-10-16 LAB — COMPREHENSIVE METABOLIC PANEL
A/G RATIO: 2 (ref 1.2–2.2)
ALK PHOS: 56 IU/L (ref 39–117)
ALT: 6 IU/L (ref 0–32)
AST: 14 IU/L (ref 0–40)
Albumin: 4.1 g/dL (ref 3.5–4.7)
BILIRUBIN TOTAL: 0.3 mg/dL (ref 0.0–1.2)
BUN / CREAT RATIO: 14 (ref 11–26)
BUN: 10 mg/dL (ref 8–27)
CALCIUM: 9.8 mg/dL (ref 8.7–10.3)
CHLORIDE: 99 mmol/L (ref 96–106)
CO2: 26 mmol/L (ref 18–29)
CREATININE: 0.7 mg/dL (ref 0.57–1.00)
GFR calc non Af Amer: 79 mL/min/{1.73_m2} (ref >59)
GFR, EST AFRICAN AMERICAN: 91 mL/min/{1.73_m2} (ref >59)
GLOBULIN, TOTAL: 2.1 g/dL (ref 1.5–4.5)
Glucose: 84 mg/dL (ref 65–99)
Potassium: 4.5 mmol/L (ref 3.5–5.2)
SODIUM: 141 mmol/L (ref 134–144)
Total Protein: 6.2 g/dL (ref 6.0–8.5)

## 2015-10-16 LAB — LIPID PANEL W/O CHOL/HDL RATIO
Cholesterol, Total: 209 mg/dL — ABNORMAL HIGH (ref 100–199)
HDL: 53 mg/dL (ref >39)
LDL CALC: 128 mg/dL — AB (ref 0–99)
TRIGLYCERIDES: 138 mg/dL (ref 0–149)
VLDL Cholesterol Cal: 28 mg/dL (ref 5–40)

## 2015-10-16 LAB — TSH: TSH: 1.09 u[IU]/mL (ref 0.450–4.500)

## 2015-10-16 LAB — VITAMIN D 25 HYDROXY (VIT D DEFICIENCY, FRACTURES): Vit D, 25-Hydroxy: 27.5 ng/mL — ABNORMAL LOW (ref 30.0–100.0)

## 2015-10-16 NOTE — Telephone Encounter (Signed)
I printed back out the form from [facility] and placed in it your blue box.

## 2015-10-16 NOTE — Telephone Encounter (Signed)
C[facility] with [facility] would llike to have home health orders signed since she had her appt yesterday.

## 2015-10-17 ENCOUNTER — Telehealth: Payer: Self-pay

## 2015-10-17 NOTE — Telephone Encounter (Signed)
See other phone note; okay for help

## 2015-10-17 NOTE — Telephone Encounter (Signed)
Junious Dresseronnie, OT, with [facility] would like verbal orders to do OT twice a week for 2 weeks and then once a week for 3 weeks. They would also like a verbal order for home health aide to help with ADL's twice a week x 5 weeks.

## 2015-10-17 NOTE — Telephone Encounter (Signed)
That's fine; please give orders

## 2015-10-20 NOTE — Telephone Encounter (Signed)
Verbal order given for OT and home health.

## 2016-02-02 ENCOUNTER — Ambulatory Visit: Payer: Medicare Other | Admitting: Family Medicine

## 2016-02-26 ENCOUNTER — Encounter: Payer: Self-pay | Admitting: Family Medicine

## 2016-02-26 ENCOUNTER — Ambulatory Visit (INDEPENDENT_AMBULATORY_CARE_PROVIDER_SITE_OTHER): Payer: Medicare Other | Admitting: Family Medicine

## 2016-02-26 VITALS — BP 132/80 | HR 72 | Temp 98.0°F | Resp 16 | Ht 58.5 in | Wt 130.0 lb

## 2016-02-26 DIAGNOSIS — R296 Repeated falls: Secondary | ICD-10-CM

## 2016-02-26 DIAGNOSIS — F0390 Unspecified dementia without behavioral disturbance: Secondary | ICD-10-CM | POA: Diagnosis not present

## 2016-02-26 DIAGNOSIS — Z66 Do not resuscitate: Secondary | ICD-10-CM | POA: Diagnosis not present

## 2016-02-26 DIAGNOSIS — Z9181 History of falling: Secondary | ICD-10-CM

## 2016-02-26 NOTE — Assessment & Plan Note (Signed)
Yes, per caregiver

## 2016-02-26 NOTE — Assessment & Plan Note (Signed)
Refer to [facility] for PT

## 2016-02-26 NOTE — Progress Notes (Signed)
BP 132/80   Pulse 72   Temp 98 F (36.7 C) (Oral)   Resp 16   Ht 4' 10.5" (1.486 m)   Wt 130 lb (59 kg)   SpO2 96%   BMI 26.71 kg/m    Subjective:    Patient ID: Felicia [name], female    DOB: August 05, 1927, 82 y.o.   MRN: [id number]  HPI: Felicia [name] is a 82 y.o. female  Chief Complaint  Patient presents with  . paperwork    Here without any family member or guardian, brought today by a lady who sits with her; she says they (family and patient) just went to an attorney in [place] to try to get all of the guardianship stuff taken care of She is here with a woman who stays with her during the day Daughter is not here today Patient's husband was a veteran; [facility] will supply money to stay with them during the day They were in a nursing home and got "kicked out" because they were sleeping in the same bed "They shouldn't be alone" but they need someone to be there; they are no wandering off It's a bad situation; husband is so weak and thinks caregiver is his grandmother Aubriana had to be fought with to come today She is very weak and has dementia; his is awful says woman who sits with them They need paperwork to get extra help from the [place] so someone can come stay with them around the clock Patient has gained 20 pounds since leaving the nursing home I had put in a referral to neurology in March but they have not been  The daughter decided to not go to neurologist; I had recommended that, but they did not take care She has been to dentist and dermatologist They took her off of all vitamins except for vitamin D The daughter is involved in her care, takes turns coming in on the weekends and evenings They are alone at night They went to [facility] to get legal matters straightened out last week They have someone come in 3 days a week for a bath and every morning for breakfast and housework Caregiver here is here every day for lunch and medications Another staff member comes in 3 days a week  for dinner and medications Children fill in the gaps They are alone at night Not sure if they would be able to get out if emergency Hortensia can dial 911, he can't She has not had any falls Dementia and weakness; was getting PT but that ended, a couple of months ago  Depression screen [facility] 2/9 02/26/2016 03/21/2015  Decreased Interest 0 0  Down, Depressed, Hopeless 0 0  PHQ - 2 Score 0 0   Relevant past medical, surgical, family and social history reviewed Past Medical History:  Diagnosis Date  . Condyloma acuminatum   . Dementia   . Hyperlipidemia   . Hypertension   . Osteoporosis   . Post-menopausal   . Rosacea   . Thalamic infarction (HCC)   . Urinary retention    chronic  . Vitamin D deficiency disease    Past Surgical History:  Procedure Laterality Date  . SUPRAPUBIC CATHETER PLACEMENT  Nov. 2014   Family History  Problem Relation Age of Onset  . CAD Father    Social History  Substance Use Topics  . Smoking status: Never Smoker  . Smokeless tobacco: Never Used  . Alcohol use No   Interim medical history since last visit  reviewed. Allergies and medications reviewed  Review of Systems Per HPI unless specifically indicated above     Objective:    BP 132/80   Pulse 72   Temp 98 F (36.7 C) (Oral)   Resp 16   Ht 4' 10.5" (1.486 m)   Wt 130 lb (59 kg)   SpO2 96%   BMI 26.71 kg/m   Wt Readings from Last 3 Encounters:  02/26/16 130 lb (59 kg)  10/15/15 109 lb (49.4 kg)  10/02/15 113 lb 3.2 oz (51.3 kg)    Physical Exam  Constitutional: She appears well-developed and well-nourished. No distress.  Skin: She is not diaphoretic. No pallor.  Psychiatric: Her mood appears not anxious. Cognition and memory are not impaired. She does not exhibit a depressed mood. She exhibits normal recent memory.  Smiling, answers questions with simple responses; does not initiate conversation on her own   Results for orders placed or performed in visit on 10/15/15  CBC with  Differential/Platelet  Result Value Ref Range   WBC 4.5 3.4 - 10.8 x10E3/uL   RBC 4.74 3.77 - 5.28 x10E6/uL   Hemoglobin 14.2 11.1 - 15.9 g/dL   Hematocrit 16.1 09.6 - 46.6 %   MCV 92 79 - 97 fL   MCH 30.0 26.6 - 33.0 pg   MCHC 32.5 31.5 - 35.7 g/dL   RDW 04.5 40.9 - 81.1 %   Platelets 249 150 - 379 x10E3/uL   Neutrophils 60 %   Lymphs 25 %   Monocytes 13 %   Eos 2 %   Basos 0 %   Neutrophils Absolute 2.7 1.4 - 7.0 x10E3/uL   Lymphocytes Absolute 1.1 0.7 - 3.1 x10E3/uL   Monocytes Absolute 0.6 0.1 - 0.9 x10E3/uL   EOS (ABSOLUTE) 0.1 0.0 - 0.4 x10E3/uL   Basophils Absolute 0.0 0.0 - 0.2 x10E3/uL   Immature Granulocytes 0 %   Immature Grans (Abs) 0.0 0.0 - 0.1 x10E3/uL  Comprehensive metabolic panel  Result Value Ref Range   Glucose 84 65 - 99 mg/dL   BUN 10 8 - 27 mg/dL   Creatinine, Ser 9.14 0.57 - 1.00 mg/dL   GFR calc non Af Amer 79 >59 mL/min/1.73   GFR calc Af Amer 91 >59 mL/min/1.73   BUN/Creatinine Ratio 14 11 - 26   Sodium 141 134 - 144 mmol/L   Potassium 4.5 3.5 - 5.2 mmol/L   Chloride 99 96 - 106 mmol/L   CO2 26 18 - 29 mmol/L   Calcium 9.8 8.7 - 10.3 mg/dL   Total Protein 6.2 6.0 - 8.5 g/dL   Albumin 4.1 3.5 - 4.7 g/dL   Globulin, Total 2.1 1.5 - 4.5 g/dL   Albumin/Globulin Ratio 2.0 1.2 - 2.2   Bilirubin Total 0.3 0.0 - 1.2 mg/dL   Alkaline Phosphatase 56 39 - 117 IU/L   AST 14 0 - 40 IU/L   ALT 6 0 - 32 IU/L  TSH  Result Value Ref Range   TSH 1.090 0.450 - 4.500 uIU/mL  VITAMIN D 25 Hydroxy (Vit-D Deficiency, Fractures)  Result Value Ref Range   Vit D, 25-Hydroxy 27.5 (L) 30.0 - 100.0 ng/mL  Lipid Panel w/o Chol/HDL Ratio  Result Value Ref Range   Cholesterol, Total 209 (H) 100 - 199 mg/dL   Triglycerides 782 0 - 149 mg/dL   HDL 53 >95 mg/dL   VLDL Cholesterol Cal 28 5 - 40 mg/dL   LDL Calculated 621 (H) 0 - 99 mg/dL  Assessment & Plan:   Problem List Items Addressed This Visit      Nervous and Auditory   Dementia - Primary    Patient  needs addiitonal help in the home, which the [facility] can provide per sitter; forms completed to the best of my ability, with sitter providing some answers; daughter did not accompany patient for this today; I was disappointed that they did not follow-through with the referral to neurology several months ago but respect daughter's wishes; I asked sitter if there was any reason in her mind to involve APS; sitter does not think patient unsafe, can call 911 if needed, but definitely can use more help in the home        Other   DNAR (do not attempt resuscitation)    Yes, per caregiver      At moderate risk for fall    Refer to [facility] for PT      Relevant Orders   Ambulatory referral to Physical Therapy    Other Visit Diagnoses   None.     Follow up plan: No Follow-up on file.  An after-visit summary was printed and given to the patient at check-out.  Please see the patient instructions which may contain other information and recommendations beyond what is mentioned above in the assessment and plan.  Meds ordered this encounter  Medications  . cholecalciferol (VITAMIN D) 1000 units tablet    Sig: Take 1,000 Units by mouth daily.    Orders Placed This Encounter  Procedures  . Ambulatory referral to Physical Therapy   See forms completed Face-to-face time with patient was more than 25 minutes, >50% time spent counseling and coordination of care

## 2016-02-29 NOTE — Assessment & Plan Note (Signed)
Patient needs addiitonal help in the home, which the [facility] can provide per sitter; forms completed to the best of my ability, with sitter providing some answers; daughter did not accompany patient for this today; I was disappointed that they did not follow-through with the referral to neurology several months ago but respect daughter's wishes; I asked sitter if there was any reason in her mind to involve APS; sitter does not think patient unsafe, can call 911 if needed, but definitely can use more help in the home

## 2016-03-02 ENCOUNTER — Other Ambulatory Visit: Payer: Self-pay | Admitting: Family Medicine

## 2016-03-02 DIAGNOSIS — Z9181 History of falling: Secondary | ICD-10-CM

## 2016-04-26 ENCOUNTER — Telehealth: Payer: Self-pay | Admitting: Family Medicine

## 2016-04-26 NOTE — Telephone Encounter (Signed)
[facility]. Want to extend physical therapy 2x weekly for 7 more weeks. Patient is making progress and is doing well. [phone]

## 2016-04-26 NOTE — Telephone Encounter (Signed)
Yes, that will be fine; thank you

## 2016-04-26 NOTE — Telephone Encounter (Signed)
Left voicemail giving verbal order. 

## 2016-05-05 NOTE — Progress Notes (Signed)
Refer to PT

## 2016-05-05 NOTE — Assessment & Plan Note (Signed)
Refer to PT

## 2016-10-14 ENCOUNTER — Telehealth: Payer: Self-pay

## 2016-10-14 NOTE — Telephone Encounter (Signed)
Daughter called wants to see about getting mother back on PT (physical therapy) wants to use [facility]

## 2016-10-14 NOTE — Telephone Encounter (Signed)
Patient would need an appointment please; Medicare rules; thank you

## 2016-10-15 NOTE — Telephone Encounter (Signed)
Left voice mail on son's phone.

## 2016-10-15 NOTE — Telephone Encounter (Signed)
I have tried to call patient back several times with no answer.

## 2016-10-22 ENCOUNTER — Ambulatory Visit: Payer: Medicare Other | Admitting: Family Medicine

## 2016-11-02 ENCOUNTER — Ambulatory Visit: Payer: Medicare Other | Admitting: Family Medicine

## 2016-12-01 ENCOUNTER — Encounter: Payer: Self-pay | Admitting: Family Medicine

## 2016-12-01 ENCOUNTER — Ambulatory Visit (INDEPENDENT_AMBULATORY_CARE_PROVIDER_SITE_OTHER): Payer: Medicare Other | Admitting: Family Medicine

## 2016-12-01 VITALS — BP 122/74 | HR 74 | Temp 97.5°F | Resp 16 | Ht 58.9 in | Wt 129.8 lb

## 2016-12-01 DIAGNOSIS — D696 Thrombocytopenia, unspecified: Secondary | ICD-10-CM

## 2016-12-01 DIAGNOSIS — Z111 Encounter for screening for respiratory tuberculosis: Secondary | ICD-10-CM | POA: Diagnosis not present

## 2016-12-01 DIAGNOSIS — E559 Vitamin D deficiency, unspecified: Secondary | ICD-10-CM

## 2016-12-01 DIAGNOSIS — F0390 Unspecified dementia without behavioral disturbance: Secondary | ICD-10-CM | POA: Diagnosis not present

## 2016-12-01 DIAGNOSIS — Z9181 History of falling: Secondary | ICD-10-CM

## 2016-12-01 DIAGNOSIS — I10 Essential (primary) hypertension: Secondary | ICD-10-CM | POA: Diagnosis not present

## 2016-12-01 DIAGNOSIS — E782 Mixed hyperlipidemia: Secondary | ICD-10-CM

## 2016-12-01 DIAGNOSIS — Z0289 Encounter for other administrative examinations: Secondary | ICD-10-CM

## 2016-12-01 DIAGNOSIS — R531 Weakness: Secondary | ICD-10-CM

## 2016-12-01 LAB — CBC WITH DIFFERENTIAL/PLATELET
BASOS PCT: 0 %
Basophils Absolute: 0 cells/uL (ref 0–200)
Eosinophils Absolute: 165 cells/uL (ref 15–500)
Eosinophils Relative: 3 %
HCT: 43.5 % (ref 35.0–45.0)
Hemoglobin: 14.2 g/dL (ref 11.7–15.5)
LYMPHS PCT: 25 %
Lymphs Abs: 1375 cells/uL (ref 850–3900)
MCH: 29.8 pg (ref 27.0–33.0)
MCHC: 32.6 g/dL (ref 32.0–36.0)
MCV: 91.4 fL (ref 80.0–100.0)
MONO ABS: 605 {cells}/uL (ref 200–950)
MONOS PCT: 11 %
MPV: 11.7 fL (ref 7.5–12.5)
NEUTROS ABS: 3355 {cells}/uL (ref 1500–7800)
Neutrophils Relative %: 61 %
PLATELETS: 250 10*3/uL (ref 140–400)
RBC: 4.76 MIL/uL (ref 3.80–5.10)
RDW: 13.4 % (ref 11.0–15.0)
WBC: 5.5 10*3/uL (ref 3.8–10.8)

## 2016-12-01 LAB — LIPID PANEL
CHOL/HDL RATIO: 4.2 ratio (ref <5.0)
Cholesterol: 287 mg/dL — ABNORMAL HIGH (ref <200)
HDL: 69 mg/dL (ref >50)
LDL Cholesterol: 192 mg/dL — ABNORMAL HIGH (ref <100)
Triglycerides: 131 mg/dL (ref <150)
VLDL: 26 mg/dL (ref <30)

## 2016-12-01 LAB — COMPLETE METABOLIC PANEL WITH GFR
ALT: 7 U/L (ref 6–29)
AST: 14 U/L (ref 10–35)
Albumin: 3.9 g/dL (ref 3.6–5.1)
Alkaline Phosphatase: 50 U/L (ref 33–130)
BUN: 14 mg/dL (ref 7–25)
CHLORIDE: 101 mmol/L (ref 98–110)
CO2: 28 mmol/L (ref 20–31)
CREATININE: 0.76 mg/dL (ref 0.60–0.88)
Calcium: 9.1 mg/dL (ref 8.6–10.4)
GFR, Est African American: 82 mL/min (ref >=60)
GFR, Est Non African American: 71 mL/min (ref >=60)
GLUCOSE: 68 mg/dL (ref 65–99)
Potassium: 4.1 mmol/L (ref 3.5–5.3)
Sodium: 139 mmol/L (ref 135–146)
TOTAL PROTEIN: 6.1 g/dL (ref 6.1–8.1)
Total Bilirubin: 0.5 mg/dL (ref 0.2–1.2)

## 2016-12-01 NOTE — Assessment & Plan Note (Signed)
Refer to nursing home

## 2016-12-01 NOTE — Progress Notes (Addendum)
BP 122/74 Comment: 122/74  Pulse 74   Temp 97.5 F (36.4 C) (Oral)   Resp 16   Ht 4' 10.9" (1.496 m)   Wt 129 lb 12.8 oz (58.9 kg)   SpO2 93%   BMI 26.31 kg/m    Subjective:    Patient ID: Felicia [name], female    DOB: Nov 15, 1927, 83 y.o.   MRN: [id number]  HPI: Felicia [name] is a 83 y.o. female  Chief Complaint  Patient presents with  . FL-2    forgot paperwork, she is still living at home but is being placed somewhere.  They will send paperwork    HPI Patient was brought today by her daughter's boyfriend; her daughter was unable to be here Patient needs an FL-2 completed for placement at an assisted living facility She has been living at home; her husband is now at a facility and the family is wanting to place them together The patient has dementia; daughter took her off of medicine She is not a wanderer No problems with incontinence of bowel or bladder Wears glasses "to drive" she says (but patient does not drive) Appetite is pretty good; feeds self Denies needing help with bath or shower No problems voiced today, no complaints  Depression screen [facility] 2/9 12/01/2016 02/26/2016 03/21/2015  Decreased Interest 0 0 0  Down, Depressed, Hopeless 0 0 0  PHQ - 2 Score 0 0 0   Relevant past medical, surgical, family and social history reviewed Past Medical History:  Diagnosis Date  . Condyloma acuminatum   . Dementia   . Hyperlipidemia   . Hypertension   . Osteoporosis   . Post-menopausal   . Rosacea   . Thalamic infarction (HCC)   . Urinary retention    chronic  . Vitamin D deficiency disease    Past Surgical History:  Procedure Laterality Date  . SUPRAPUBIC CATHETER PLACEMENT  Nov. 2014   Family History  Problem Relation Age of Onset  . Arthritis Mother   . CAD Father   . Arthritis Father   . Arthritis Sister   . Arthritis Brother   . Arthritis Daughter   . Arthritis Son   . Ovarian cancer Neg Hx    family history includes Arthritis in her brother,  daughter, father, mother, sister, and son; CAD in her father. There is no history of Ovarian cancer.  Social History  Substance Use Topics  . Smoking status: Never Smoker  . Smokeless tobacco: Never Used  . Alcohol use No   Interim medical history since last visit reviewed. Allergies and medications reviewed  Review of Systems Per HPI unless specifically indicated above     Objective:    BP 122/74 Comment: 122/74  Pulse 74   Temp 97.5 F (36.4 C) (Oral)   Resp 16   Ht 4' 10.9" (1.496 m)   Wt 129 lb 12.8 oz (58.9 kg)   SpO2 93%   BMI 26.31 kg/m   Wt Readings from Last 3 Encounters:  12/01/16 129 lb 12.8 oz (58.9 kg)  02/26/16 130 lb (59 kg)  10/15/15 109 lb (49.4 kg)    Physical Exam  Constitutional: She appears well-developed and well-nourished. No distress.  Eyes: No scleral icterus.  Neck: No JVD present.  Cardiovascular: Normal rate and regular rhythm.   Pulmonary/Chest: Effort normal and breath sounds normal.  Abdominal: She exhibits no distension.  Musculoskeletal: She exhibits no edema.  Neurological: She is alert. She displays no tremor.  Ambulatory without assistance,  though a little slowed; gait is not shuffling; no masked facies  Skin: She is not diaphoretic.  Psychiatric: She has a normal mood and affect. Cognition and memory are impaired. She exhibits abnormal recent memory.  Thinks it is 52; knew her own birthday; did not know who examiner was; guessed month was November (it is May); pleasant, cooperative, smiling       Assessment & Plan:   Problem List Items Addressed This Visit      Cardiovascular and Mediastinum   Hypertension    Controlled without medicine        Nervous and Auditory   Dementia - Primary    Agree with placement in memory care unit; she is not a wanderer; no behavioral problems; daughter does not want her on medicine      Relevant Orders   B12 (Completed)     Other   Vitamin D deficiency disease    Check labs        Relevant Orders   VITAMIN D 25 Hydroxy (Vit-D Deficiency, Fractures) (Completed)   Thrombocytopenia (HCC)    Check labs      Relevant Orders   CBC with Differential/Platelet (Completed)   Hyperlipidemia    Check labs; however, patient has not been taking statin; data on statin initiation at age 58 is sorely lacking      Relevant Orders   Lipid panel (Completed)   Generalized weakness    Check labs today; suspect it is aging and deconditioning, not a metabolic or other problem      Relevant Orders   COMPLETE METABOLIC PANEL WITH GFR (Completed)   B12 (Completed)   At moderate risk for fall    Refer to nursing home       Other Visit Diagnoses    Screening for tuberculosis       Relevant Orders   Quantiferon tb gold assay (Completed)   Encounter for completion of form with patient       FL-2 completed      Follow up plan: No Follow-up on file.  An after-visit summary was printed and given to the patient at check-out.  Please see the patient instructions which may contain other information and recommendations beyond what is mentioned above in the assessment and plan.  Orders Placed This Encounter  Procedures  . Quantiferon tb gold assay  . CBC with Differential/Platelet  . COMPLETE METABOLIC PANEL WITH GFR  . Lipid panel  . VITAMIN D 25 Hydroxy (Vit-D Deficiency, Fractures)  . B12    Addendum: at the visit, patient was slow to get up from the chair, appeared weak; was able to walk without assistive device, but gait was slow and unsteady; she did not have a typical Parkinsonian shuffle, but she did not lift feet far off the ground and her steps were close together, and she walked very deliberately and slowly; I consider her to be a fall risk with gait instability, and she would benefit from PT evaluation and treatment Dr. Baruch Gouty, 12/09/16

## 2016-12-01 NOTE — Patient Instructions (Addendum)
We'll fill out the paperwork for you   Dementia Dementia is the loss of two or more brain functions, such as:  Memory.  Decision making.  Behavior.  Speaking.  Thinking.  Problem solving. There are many types of dementia. The most common type is called progressive dementia. Progressive dementia gets worse with time and it is irreversible. An example of this type of dementia is Alzheimer disease. What are the causes? This condition may be caused by:  Nerve cell damage in the brain.  Genetic mutations.  Certain medicines.  Multiple small strokes.  An infection, such as chronic meningitis.  A metabolic problem, such as vitamin B12 deficiency or thyroid disease.  Pressure on the brain, such as from a tumor or blood clot. What are the signs or symptoms? Symptoms of this condition include:  Sudden changes in mood.  Depression.  Problems with balance.  Changes in personality.  Poor short-term memory.  Agitation.  Delusions.  Hallucinations.  Having a hard time:  Speaking thoughts.  Finding words.  Solving problems.  Doing familiar tasks.  Understanding familiar ideas. How is this diagnosed? This condition is diagnosed with an assessment by your health care provider. During this assessment, your health care provider will talk with you and your family, friends, or caregivers about your symptoms. A thorough medical history will be taken, and you will have a physical exam and tests. Tests may include:  Lab tests, such as blood or urine tests.  Imaging tests, such as a CT scan, PET scan, or MRI.  A lumbar puncture. This test involves removing and testing a small amount of the fluid that surrounds the brain and spinal cord.  An electroencephalogram (EEG). In this test, small metal discs are used to measure electrical activity in the brain.  Memory tests, cognitive tests, and neuropsychological tests. These tests evaluate brain function. How is this  treated? Treatment depends on the cause of the dementia. It may involve taking medicines that may help:  To control the dementia.  To slow down the disease.  To manage symptoms. In some cases, treating the cause of the dementia can improve symptoms, reverse symptoms, or slow down how quickly the dementia gets worse. Your health care provider can help direct you to support groups, organizations, and other health care providers who can help with decisions about your care. Follow these instructions at home: Medicine   Take over-the-counter and prescription medicines only as told by your health care provider.  Avoid taking medicines that can affect thinking, such as pain or sleeping medicines. Lifestyle    Make healthy lifestyle choices:  Be physically active as told by your health care provider.  Do not use any tobacco products, such as cigarettes, chewing tobacco, and e-cigarettes. If you need help quitting, ask your health care provider.  Eat a healthy diet.  Practice stress-management techniques when you get stressed.  Stay social.  Drink enough fluid to keep your urine clear or pale yellow.  Make sure to get quality sleep. These tips can help you to get a good night's rest:  Avoid napping during the day.  Keep your sleeping area dark and cool.  Avoid exercising during the few hours before you go to bed.  Avoid caffeine products in the evening. General instructions   Work with your health care provider to determine what you need help with and what your safety needs are.  If you were given a bracelet that tracks your location, make sure to wear it.  Keep  all follow-up visits as told by your health care provider. This is important. Contact a health care provider if:  You have any new symptoms.  You have problems with choking or swallowing.  You have any symptoms of a different illness. Get help right away if:  You develop a fever.  You have new or worsening  confusion.  You have new or worsening sleepiness.  You have a hard time staying awake.  You or your family members become concerned for your safety. This information is not intended to replace advice given to you by your health care provider. Make sure you discuss any questions you have with your health care provider. Document Released: 01/12/2001 Document Revised: 11/27/2015 Document Reviewed: 04/16/2015 Elsevier Interactive Patient Education  2017 [employer]  Dementia Caregiver Guide Dementia is a term used to describe a number of symptoms that affect memory and thinking. The most common symptoms include:  Memory loss.  Trouble with language and communication.  Trouble concentrating.  Poor judgment.  Problems with reasoning.  Child-like behavior and language.  Extreme anxiety.  Angry outbursts.  Wandering from home or public places. Dementia usually gets worse slowly over time. In the early stages, people with dementia can stay independent and safe with some help. In later stages, they need help with daily tasks such as dressing, grooming, and using the bathroom. How to help the person with dementia cope Dementia can be frightening and confusing. Here are some tips to help the person with dementia cope with changes caused by the disease. General tips   Keep the person on track with his or her routine.  Try to identify areas where the person may need help.  Be supportive, patient, calm, and encouraging.  Gently remind the person that adjusting to changes takes time.  Help with the tasks that the person has asked for help with.  Keep the person involved in daily tasks and decisions as much as possible.  Encourage conversation, but try not to get frustrated or harried if the person struggles to find words or does not seem to appreciate your help. Communication tips   When the person is talking or seems frustrated, make eye contact and hold the person's  hand.  Ask specific questions that need yes or no answers.  Use simple words, short sentences, and a calm voice. Only give one direction at a time.  When offering choices, limit them to just 1 or 2.  Avoid correcting the person in a negative way.  If the person is struggling to find the right words, gently try to help him or her. How to recognize symptoms of stress Symptoms of stress in caregivers include:  Feeling frustrated or angry with the person with dementia.  Denying that the person has dementia or that his or her symptoms will not improve.  Feeling hopeless and unappreciated.  Difficulty sleeping.  Difficulty concentrating.  Feeling anxious, irritable, or depressed.  Developing stress-related health problems.  Feeling like you have too little time for your own life. Follow these instructions at home:  Make sure that you and the person you are caring for:  Get regular sleep.  Exercise regularly.  Eat regular, nutritious meals.  Drink enough fluid to keep your urine clear or pale yellow.  Take over-the-counter and prescription medicines only as told by your health care providers.  Attend all scheduled health care appointments.  Join a support group with others who are caregivers.  Ask about respite care resources so that you can  have a regular break from the stress of caregiving.  Look for signs of stress in yourself and in the person you are caring for. If you notice signs of stress, take steps to manage it.  Consider any safety risks and take steps to avoid them.  Organize medications in a pill box for each day of the week.  Create a plan to handle any legal or financial matters. Get legal or financial advice if needed.  Keep a calendar in a central location to remind the person of appointments or other activities. Tips for reducing the risk of injury  Keep floors clear of clutter. Remove rugs, magazine racks, and floor lamps.  Keep hallways well  lit, especially at night.  Put a handrail and nonslip mat in the bathtub or shower.  Put childproof locks on cabinets that contain dangerous items, such as medicines, alcohol, guns, toxic cleaning items, sharp tools or utensils, matches, and lighters.  Put the locks in places where the person cannot see or reach them easily. This will help ensure that the person does not wander out of the house and get lost.  Be prepared for emergencies. Keep a list of emergency phone numbers and addresses in a convenient area.  Remove car keys and lock garage doors so that the person does not try to get in the car and drive.  Have the person wear a bracelet that tracks locations and identifies the person as having memory problems. This should be worn at all times for safety. Where to find support: Many individuals and organizations offer support. These include:  Support groups for people with dementia and for caregivers.  Counselors or therapists.  Home health care services.  Adult day care centers. Where to find more information: Alzheimer's Association: [employer] Contact a health care provider if:  The person's health is rapidly getting worse.  You are no longer able to care for the person.  Caring for the person is affecting your physical and emotional health.  The person threatens himself or herself, you, or anyone else. Summary  Dementia is a term used to describe a number of symptoms that affect memory and thinking.  Dementia usually gets worse slowly over time.  Take steps to reduce the person's risk of injury, and to plan for future care.  Caregivers need support, relief from caregiving, and time for their own lives. This information is not intended to replace advice given to you by your health care provider. Make sure you discuss any questions you have with your health care provider. Document Released: 06/22/2016 Document Revised: 06/22/2016 Document Reviewed:  06/22/2016 Elsevier Interactive Patient Education  2017 [employer].

## 2016-12-01 NOTE — Assessment & Plan Note (Addendum)
Check labs; however, patient has not been taking statin; data on statin initiation at age 83 is sorely lacking

## 2016-12-01 NOTE — Assessment & Plan Note (Addendum)
Check labs today; suspect it is aging and deconditioning, not a metabolic or other problem

## 2016-12-01 NOTE — Assessment & Plan Note (Addendum)
Agree with placement in memory care unit; she is not a wanderer; no behavioral problems; daughter does not want her on medicine

## 2016-12-01 NOTE — Assessment & Plan Note (Signed)
Check labs 

## 2016-12-02 ENCOUNTER — Encounter: Payer: Self-pay | Admitting: Family Medicine

## 2016-12-02 LAB — VITAMIN D 25 HYDROXY (VIT D DEFICIENCY, FRACTURES): VIT D 25 HYDROXY: 24 ng/mL — AB (ref 30–100)

## 2016-12-02 LAB — VITAMIN B12: VITAMIN B 12: 314 pg/mL (ref 200–1100)

## 2016-12-03 ENCOUNTER — Telehealth: Payer: Self-pay | Admitting: Family Medicine

## 2016-12-03 LAB — QUANTIFERON TB GOLD ASSAY (BLOOD)
Interferon Gamma Release Assay: NEGATIVE
Mitogen-Nil: 8.34 IU/mL
Quantiferon Nil Value: 0.05 IU/mL
Quantiferon Tb Ag Minus Nil Value: <0 IU/mL

## 2016-12-03 NOTE — Telephone Encounter (Signed)
I spoke with daughter They both came down with flu last year, then went to nursing home and lost a lot of weight Tried to police her ice cream At this age, just let her eat what she wants Could change that when she gets to assisted living to lower fat They are [place] to get [place] [facility] I asked why no memory medicine; she used to take namenda, she kept saying that it made her dizzy, found out it was for memory and she got upset; she thinks it would be okay; could just tell her it's a vitamin; daughter doesn't feel it really necessary, just to slow down progression; discussed her being able to toilet and feed herself; daughter pulled her father back off of the dementia medicine; $500 a month to administer medicines; [facility] Daughter decided she does not want to put her back on the memory meds at this time; declined starting I recommended: 1000 iu of vitamin D3 daily 1000 mcg of vitamin B12 daily Daughter will give her the vitamins there She'll be able to socialize and engage more -------------------------- [facility] staff -- [facility]

## 2016-12-03 NOTE — Telephone Encounter (Signed)
Done

## 2016-12-04 NOTE — Assessment & Plan Note (Signed)
Controlled without medicine 

## 2016-12-06 ENCOUNTER — Telehealth: Payer: Self-pay

## 2016-12-06 NOTE — Telephone Encounter (Signed)
That's beyond my scope of practice; she'll want to contact the patient's eye doctor

## 2016-12-06 NOTE — Telephone Encounter (Signed)
Pt daughter called asking for eye drops for macular degeneration.

## 2016-12-07 NOTE — Telephone Encounter (Signed)
Left voicemail to give me a call back.  

## 2016-12-09 ENCOUNTER — Telehealth: Payer: Self-pay

## 2016-12-09 NOTE — Telephone Encounter (Signed)
Spoke with Felicia [name]. She's notified,. She has access to epic therefore she will pull it up .

## 2016-12-09 NOTE — Telephone Encounter (Signed)
She states she received an order for physical therapy but the visit note from 5/2 states that the patient walks without assistance and does not shuffle. Crist InfanteSarah Napier, PT asking if your able to amend that note describing why PT is needed or does the patient need to be seen again? She left her number just in case you have any questions. # [phone]

## 2016-12-09 NOTE — Telephone Encounter (Signed)
The patient did walk without assistive device, but was slow to get up from chair, had slow deliberate steps while walking, etc. I will be glad to append my visit note and you can send that back to her Thank you

## 2016-12-13 ENCOUNTER — Telehealth: Payer: Self-pay

## 2016-12-13 NOTE — Telephone Encounter (Signed)
Pt daughter states you mention to her about taking  medicine for her memory. She states at the time she didn't have anyone to help manage her moms medicines how she wanted  but now she does and would like to get her on the medicine.

## 2016-12-14 MED ORDER — MEMANTINE HCL ER 7 & 14 & 21 &28 MG PO CP24: ORAL_CAPSULE | ORAL | 0 refills | Status: DC

## 2016-12-14 NOTE — Addendum Note (Signed)
Addended by: Laszlo Ellerby, Janit BernMELINDA P on: 12/14/2016 04:14 PM   Modules accepted: Orders

## 2016-12-14 NOTE — Telephone Encounter (Signed)
Pt daughter notified   

## 2016-12-14 NOTE — Telephone Encounter (Signed)
Great I've sent in the new Rx Please let her know

## 2016-12-22 ENCOUNTER — Telehealth: Payer: Self-pay

## 2016-12-22 NOTE — Telephone Encounter (Signed)
See other phone note; pharmacy was going to try to break it down for them

## 2016-12-22 NOTE — Telephone Encounter (Signed)
The memory med was to expensive, please advise

## 2016-12-22 NOTE — Telephone Encounter (Signed)
That's fine One week of 7 mg ER daily One week of 14 mg ER daily One week of 21 mg ER daily Then 28 mg ER daily

## 2016-12-22 NOTE — Telephone Encounter (Signed)
[facility]  States the Memantine  7,14 & 21 pack is to expensive for them. He states he can do 14, 21, 28 pack to see if it comes out cheaper

## 2016-12-23 MED ORDER — MEMANTINE HCL 28 X 5 MG & 21 X 10 MG PO TABS: ORAL_TABLET | ORAL | 12 refills | Status: DC

## 2016-12-23 NOTE — Telephone Encounter (Signed)
Sam, Pharmacist has been notified.

## 2016-12-23 NOTE — Telephone Encounter (Signed)
I'll switch to the BID dosing Please send copy of new medicine orders to assisted living home

## 2016-12-23 NOTE — Telephone Encounter (Signed)
Sam called from [facility] and he states its still to expensive. He states that they can do Namenda non XR which will be ER and it will cheaper. He also states if you have any more concerns or question about the medication please call him he just don't know if ts any other way to go to have it cheaper. Sam is pharmacist and the number is [phone].

## 2016-12-23 NOTE — Telephone Encounter (Signed)
She called yesterday while at the pharmacy and it was still to expensive even with the titration pack

## 2016-12-24 NOTE — Telephone Encounter (Signed)
[facility] to [facility].

## 2017-01-03 ENCOUNTER — Telehealth: Payer: Self-pay

## 2017-01-03 NOTE — Telephone Encounter (Signed)
Felicia [name], [facility] Called and states pt missed her therapy appt this week therefore she wasn't a verbal to work with the pt  Twice this week and twice the next. Gave her a verbal.

## 2017-05-13 ENCOUNTER — Encounter: Payer: Medicare Other | Admitting: Family Medicine

## 2017-05-16 ENCOUNTER — Encounter: Payer: Self-pay | Admitting: Family Medicine

## 2017-05-16 ENCOUNTER — Ambulatory Visit (INDEPENDENT_AMBULATORY_CARE_PROVIDER_SITE_OTHER): Payer: Medicare Other | Admitting: Family Medicine

## 2017-05-16 VITALS — BP 110/72 | HR 82 | Temp 97.4°F | Resp 14 | Wt 119.5 lb

## 2017-05-16 DIAGNOSIS — F0390 Unspecified dementia without behavioral disturbance: Secondary | ICD-10-CM | POA: Diagnosis not present

## 2017-05-16 DIAGNOSIS — R634 Abnormal weight loss: Secondary | ICD-10-CM | POA: Insufficient documentation

## 2017-05-16 DIAGNOSIS — Z9181 History of falling: Secondary | ICD-10-CM

## 2017-05-16 DIAGNOSIS — R531 Weakness: Secondary | ICD-10-CM | POA: Diagnosis not present

## 2017-05-16 DIAGNOSIS — R2681 Unsteadiness on feet: Secondary | ICD-10-CM | POA: Insufficient documentation

## 2017-05-16 DIAGNOSIS — E782 Mixed hyperlipidemia: Secondary | ICD-10-CM

## 2017-05-16 DIAGNOSIS — Z23 Encounter for immunization: Secondary | ICD-10-CM

## 2017-05-16 DIAGNOSIS — Z66 Do not resuscitate: Secondary | ICD-10-CM | POA: Diagnosis not present

## 2017-05-16 DIAGNOSIS — E559 Vitamin D deficiency, unspecified: Secondary | ICD-10-CM | POA: Diagnosis not present

## 2017-05-16 DIAGNOSIS — I10 Essential (primary) hypertension: Secondary | ICD-10-CM

## 2017-05-16 MED ORDER — MEMANTINE HCL 28 X 5 MG & 21 X 10 MG PO TABS: ORAL_TABLET | ORAL | 12 refills | Status: DC

## 2017-05-16 NOTE — Assessment & Plan Note (Signed)
Check labs; encouraged 3 meals a day

## 2017-05-16 NOTE — Patient Instructions (Addendum)
We'll get labs today Return in 4 weeks for follow-up  Fall Prevention in the Home Falls can cause injuries. They can happen to people of all ages. There are many things you can do to make your home safe and to help prevent falls. What can I do on the outside of my home?  Regularly fix the edges of walkways and driveways and fix any cracks.  Remove anything that might make you trip as you walk through a door, such as a raised step or threshold.  Trim any bushes or trees on the path to your home.  Use bright outdoor lighting.  Clear any walking paths of anything that might make someone trip, such as rocks or tools.  Regularly check to see if handrails are loose or broken. Make sure that both sides of any steps have handrails.  Any raised decks and porches should have guardrails on the edges.  Have any leaves, snow, or ice cleared regularly.  Use sand or salt on walking paths during winter.  Clean up any spills in your garage right away. This includes oil or grease spills. What can I do in the bathroom?  Use night lights.  Install grab bars by the toilet and in the tub and shower. Do not use towel bars as grab bars.  Use non-skid mats or decals in the tub or shower.  If you need to sit down in the shower, use a plastic, non-slip stool.  Keep the floor dry. Clean up any water that spills on the floor as soon as it happens.  Remove soap buildup in the tub or shower regularly.  Attach bath mats securely with double-sided non-slip rug tape.  Do not have throw rugs and other things on the floor that can make you trip. What can I do in the bedroom?  Use night lights.  Make sure that you have a light by your bed that is easy to reach.  Do not use any sheets or blankets that are too big for your bed. They should not hang down onto the floor.  Have a firm chair that has side arms. You can use this for support while you get dressed.  Do not have throw rugs and other things on  the floor that can make you trip. What can I do in the kitchen?  Clean up any spills right away.  Avoid walking on wet floors.  Keep items that you use a lot in easy-to-reach places.  If you need to reach something above you, use a strong step stool that has a grab bar.  Keep electrical cords out of the way.  Do not use floor polish or wax that makes floors slippery. If you must use wax, use non-skid floor wax.  Do not have throw rugs and other things on the floor that can make you trip. What can I do with my stairs?  Do not leave any items on the stairs.  Make sure that there are handrails on both sides of the stairs and use them. Fix handrails that are broken or loose. Make sure that handrails are as long as the stairways.  Check any carpeting to make sure that it is firmly attached to the stairs. Fix any carpet that is loose or worn.  Avoid having throw rugs at the top or bottom of the stairs. If you do have throw rugs, attach them to the floor with carpet tape.  Make sure that you have a light switch at the top of  the stairs and the bottom of the stairs. If you do not have them, ask someone to add them for you. What else can I do to help prevent falls?  Wear shoes that: ? Do not have high heels. ? Have rubber bottoms. ? Are comfortable and fit you well. ? Are closed at the toe. Do not wear sandals.  If you use a stepladder: ? Make sure that it is fully opened. Do not climb a closed stepladder. ? Make sure that both sides of the stepladder are locked into place. ? Ask someone to hold it for you, if possible.  Clearly mark and make sure that you can see: ? Any grab bars or handrails. ? First and last steps. ? Where the edge of each step is.  Use tools that help you move around (mobility aids) if they are needed. These include: ? Canes. ? Walkers. ? Scooters. ? Crutches.  Turn on the lights when you go into a dark area. Replace any light bulbs as soon as they burn  out.  Set up your furniture so you have a clear path. Avoid moving your furniture around.  If any of your floors are uneven, fix them.  If there are any pets around you, be aware of where they are.  Review your medicines with your doctor. Some medicines can make you feel dizzy. This can increase your chance of falling. Ask your doctor what other things that you can do to help prevent falls. This information is not intended to replace advice given to you by your health care provider. Make sure you discuss any questions you have with your health care provider. Document Released: 05/15/2009 Document Revised: 12/25/2015 Document Reviewed: 08/23/2014 Elsevier Interactive Patient Education  [employer].

## 2017-05-16 NOTE — Assessment & Plan Note (Signed)
Check labs, refer to PT

## 2017-05-16 NOTE — Assessment & Plan Note (Signed)
Check labs 

## 2017-05-16 NOTE — Assessment & Plan Note (Signed)
Encouraged walking at the home, order PT, [facility]

## 2017-05-16 NOTE — Assessment & Plan Note (Signed)
Stable per daughter, slowly progressive per MD; will start Namenda after discussing with daughter

## 2017-05-16 NOTE — Assessment & Plan Note (Signed)
Discussed with daughter 

## 2017-05-16 NOTE — Assessment & Plan Note (Signed)
Well controlled without medicine 

## 2017-05-16 NOTE — Assessment & Plan Note (Signed)
Check B12, refer to PT

## 2017-05-16 NOTE — Progress Notes (Signed)
BP 110/72   Pulse 82   Temp (!) 97.4 F (36.3 C) (Oral)   Resp 14   Wt 119 lb 8 oz (54.2 kg)   SpO2 96%   BMI 24.22 kg/m    Subjective:    Patient ID: Felicia [name], female    DOB: 1927/08/03, 83 y.o.   MRN: [id number]  HPI: Felicia [name] is a 83 y.o. female  Chief Complaint  Patient presents with  . Paperwork    F-L2    HPI Patient is here for f/u and an FL-2 form; she has moved from larger assisted living facility to a smaller assisted living facility; daughter spoke with provider on the phone before the visit; getting settled in; patient's husband Maxie is there too; staff mentioned that she has a little trouble sleeping at night a few nights a week, asked about melatonin Patient has dementia; Namenda was prescribed months ago, but daughter says she isn't taking it right now, wants her to start it; daughter thinks her memory is stable Taking vitamin D and vitamin B12 per previous instructions daughter says, though not on the [facility] from assisted living  New eye drops from eye doctor, Dr. Wallace Going Flu shot discussed with daughter; she agrees after discussion She has living will; DNR, drawn up before daughter says Daughter has not gone before a judge about competency, but she agrees that the patient could likely not consent herself for a surgery Daughter takes care of all of her finances Patient had some dizziness coming in; had not eaten breakfast this morning says staff member; has been doing well overall  Depression screen [facility] 2/9 05/16/2017 12/01/2016 02/26/2016 03/21/2015  Decreased Interest 0 0 0 0  Down, Depressed, Hopeless 0 0 0 0  PHQ - 2 Score 0 0 0 0   Relevant past medical, surgical, family and social history reviewed Past Medical History:  Diagnosis Date  . Condyloma acuminatum   . Dementia   . Hyperlipidemia   . Hypertension   . Osteoporosis   . Post-menopausal   . Rosacea   . Thalamic infarction ([place])   . Urinary retention    chronic  . Vitamin D  deficiency disease    Past Surgical History:  Procedure Laterality Date  . SUPRAPUBIC CATHETER PLACEMENT  Nov. 2014   Family History  Problem Relation Age of Onset  . Arthritis Mother   . CAD Father   . Arthritis Father   . Arthritis Sister   . Arthritis Brother   . Arthritis Daughter   . Arthritis Son   . Ovarian cancer Neg Hx    Social History   Social History  . Marital status: Married    Spouse name: N/A  . Number of children: N/A  . Years of education: N/A   Occupational History  . Not on file.   Social History Main Topics  . Smoking status: Never Smoker  . Smokeless tobacco: Never Used  . Alcohol use No  . Drug use: No  . Sexual activity: Not Currently   Other Topics Concern  . Not on file   Social History Narrative  . No narrative on file   Interim medical history since last visit reviewed. Allergies and medications reviewed  Review of Systems  Gastrointestinal: Negative for abdominal pain.  Neurological: Positive for dizziness.  Psychiatric/Behavioral: Positive for decreased concentration.   Per HPI unless specifically indicated above     Objective:    BP 110/72   Pulse 82  Temp (!) 97.4 F (36.3 C) (Oral)   Resp 14   Wt 119 lb 8 oz (54.2 kg)   SpO2 96%   BMI 24.22 kg/m   Wt Readings from Last 3 Encounters:  05/16/17 119 lb 8 oz (54.2 kg)  12/01/16 129 lb 12.8 oz (58.9 kg)  02/26/16 130 lb (59 kg)    Physical Exam  Constitutional: She appears well-developed and well-nourished. No distress.  Weight loss of ten pounds over last 5+ months  Eyes: No scleral icterus.  Neck: No JVD present.  Cardiovascular: Normal rate and regular rhythm.   Pulmonary/Chest: Effort normal and breath sounds normal. No respiratory distress. She has no wheezes.  Abdominal: Soft. Bowel sounds are normal. She exhibits no distension. There is no guarding.  Musculoskeletal: She exhibits no edema.  Neurological: She is alert. She displays no tremor. Gait  abnormal.  Ambulatory without one person assistance, a little slowed; gait is not shuffling, no ataxia  Skin: She is not diaphoretic. No pallor.  Psychiatric: She has a normal mood and affect. Her mood appears not anxious. Cognition and memory are impaired. She does not exhibit a depressed mood. She exhibits abnormal recent memory.  Thinks it is 32; guessed month was January (it is October); pleasant, cooperative   Results for orders placed or performed in visit on 12/01/16  Quantiferon tb gold assay  Result Value Ref Range   Interferon Gamma Release Assay NEGATIVE NEGATIVE   Quantiferon Nil Value 0.05 IU/mL   Mitogen-Nil 8.34 IU/mL   Quantiferon Tb Ag Minus Nil Value <0.00 IU/mL  CBC with Differential/Platelet  Result Value Ref Range   WBC 5.5 3.8 - 10.8 K/uL   RBC 4.76 3.80 - 5.10 MIL/uL   Hemoglobin 14.2 11.7 - 15.5 g/dL   HCT 43.5 35.0 - 45.0 %   MCV 91.4 80.0 - 100.0 fL   MCH 29.8 27.0 - 33.0 pg   MCHC 32.6 32.0 - 36.0 g/dL   RDW 13.4 11.0 - 15.0 %   Platelets 250 140 - 400 K/uL   MPV 11.7 7.5 - 12.5 fL   Neutro Abs 3,355 1,500 - 7,800 cells/uL   Lymphs Abs 1,375 850 - 3,900 cells/uL   Monocytes Absolute 605 200 - 950 cells/uL   Eosinophils Absolute 165 15 - 500 cells/uL   Basophils Absolute 0 0 - 200 cells/uL   Neutrophils Relative % 61 %   Lymphocytes Relative 25 %   Monocytes Relative 11 %   Eosinophils Relative 3 %   Basophils Relative 0 %   Smear Review Criteria for review not met   COMPLETE METABOLIC PANEL WITH GFR  Result Value Ref Range   Sodium 139 135 - 146 mmol/L   Potassium 4.1 3.5 - 5.3 mmol/L   Chloride 101 98 - 110 mmol/L   CO2 28 20 - 31 mmol/L   Glucose, Bld 68 65 - 99 mg/dL   BUN 14 7 - 25 mg/dL   Creat 0.76 0.60 - 0.88 mg/dL   Total Bilirubin 0.5 0.2 - 1.2 mg/dL   Alkaline Phosphatase 50 33 - 130 U/L   AST 14 10 - 35 U/L   ALT 7 6 - 29 U/L   Total Protein 6.1 6.1 - 8.1 g/dL   Albumin 3.9 3.6 - 5.1 g/dL   Calcium 9.1 8.6 - 10.4 mg/dL   GFR,  Est African American 82 >=60 mL/min   GFR, Est Non African American 71 >=60 mL/min  Lipid panel  Result Value Ref Range  Cholesterol 287 (H) <200 mg/dL   Triglycerides 131 <150 mg/dL   HDL 69 >50 mg/dL   Total CHOL/HDL Ratio 4.2 <5.0 Ratio   VLDL 26 <30 mg/dL   LDL Cholesterol 192 (H) <100 mg/dL  VITAMIN D 25 Hydroxy (Vit-D Deficiency, Fractures)  Result Value Ref Range   Vit D, 25-Hydroxy 24 (L) 30 - 100 ng/mL  B12  Result Value Ref Range   Vitamin B-12 314 200 - 1,100 pg/mL      Assessment & Plan:   Problem List Items Addressed This Visit      Cardiovascular and Mediastinum   Hypertension    Well-controlled without medicine        Nervous and Auditory   Dementia - Primary    Stable per daughter, slowly progressive per MD; will start Namenda after discussing with daughter      Relevant Medications   memantine (NAMENDA TITRATION PAK) tablet pack     Other   Weight loss    Check labs; encouraged 3 meals a day      Relevant Orders   TSH   Weakness    Check labs, refer to PT      Relevant Orders   CBC with Differential/Platelet   COMPLETE METABOLIC PANEL WITH GFR   Vitamin D deficiency disease    Check labs      Relevant Orders   VITAMIN D 25 Hydroxy (Vit-D Deficiency, Fractures)   Hyperlipidemia    Check lipids, fasting      Relevant Orders   Lipid panel   Gait instability    Check B12, refer to PT      Relevant Orders   B12   DNAR (do not attempt resuscitation)    Discussed with daughter      Relevant Orders   DNR (Do Not Resuscitate)   At moderate risk for fall    Encouraged walking at the home, order PT, [facility]      Relevant Orders   Ambulatory referral to Physical Therapy    Other Visit Diagnoses    Needs flu shot       Relevant Orders   Flu vaccine HIGH DOSE PF (Fluzone High dose) (Completed)       Follow up plan: Return in about 4 weeks (around 06/13/2017) for follow-up visit with Dr. Sanda Klein.  An after-visit  summary was printed and given to the patient at [place].  Please see the patient instructions which may contain other information and recommendations beyond what is mentioned above in the assessment and plan.  Meds ordered this encounter  Medications  . brimonidine (ALPHAGAN) 0.2 % ophthalmic solution    Sig: Place 1 drop into both eyes 2 (two) times daily.    Refill:  5  . latanoprost (XALATAN) 0.005 % ophthalmic solution    Sig: Place 1 drop into both eyes daily.    Refill:  5  . dorzolamide (TRUSOPT) 2 % ophthalmic solution    Sig: Place 1 drop into both eyes 2 (two) times daily.  . memantine (NAMENDA TITRATION PAK) tablet pack    Sig: 5 mg/day for =1 week; 5 mg twice daily for =1 week; 15 mg/day given in 5 mg and 10 mg separated doses for =1 week; then 10 mg twice daily    Dispense:  49 tablet    Refill:  12  . vitamin B-12 (CYANOCOBALAMIN) 1000 MCG tablet    Sig: Take 1 tablet (1,000 mcg total) by mouth daily.  . cholecalciferol (VITAMIN  D) 1000 units tablet    Sig: Take 1 tablet (1,000 Units total) by mouth daily.    Orders Placed This Encounter  Procedures  . Flu vaccine HIGH DOSE PF (Fluzone High dose)  . CBC with Differential/Platelet  . COMPLETE METABOLIC PANEL WITH GFR  . Lipid panel  . TSH  . B12  . VITAMIN D 25 Hydroxy (Vit-D Deficiency, Fractures)  . Ambulatory referral to Physical Therapy  . DNR (Do Not Resuscitate)

## 2017-05-16 NOTE — Assessment & Plan Note (Signed)
Check lipids, fasting 

## 2017-05-17 LAB — COMPLETE METABOLIC PANEL WITH GFR
AG Ratio: 1.6 (calc) (ref 1.0–2.5)
ALBUMIN MSPROF: 3.9 g/dL (ref 3.6–5.1)
ALKALINE PHOSPHATASE (APISO): 61 U/L (ref 33–130)
ALT: 7 U/L (ref 6–29)
AST: 15 U/L (ref 10–35)
BILIRUBIN TOTAL: 0.6 mg/dL (ref 0.2–1.2)
BUN: 11 mg/dL (ref 7–25)
CHLORIDE: 103 mmol/L (ref 98–110)
CO2: 31 mmol/L (ref 20–32)
CREATININE: 0.7 mg/dL (ref 0.60–0.88)
Calcium: 9.4 mg/dL (ref 8.6–10.4)
GFR, Est African American: 90 mL/min/{1.73_m2} (ref >=60)
GFR, Est Non African American: 78 mL/min/{1.73_m2} (ref >=60)
Globulin: 2.4 g/dL (calc) (ref 1.9–3.7)
Glucose, Bld: 84 mg/dL (ref 65–99)
Potassium: 4.2 mmol/L (ref 3.5–5.3)
Sodium: 141 mmol/L (ref 135–146)
Total Protein: 6.3 g/dL (ref 6.1–8.1)

## 2017-05-17 LAB — CBC WITH DIFFERENTIAL/PLATELET
Basophils Absolute: 31 cells/uL (ref 0–200)
Basophils Relative: 0.6 %
EOS PCT: 2.6 %
Eosinophils Absolute: 133 cells/uL (ref 15–500)
HEMATOCRIT: 46.2 % — AB (ref 35.0–45.0)
Hemoglobin: 15.2 g/dL (ref 11.7–15.5)
LYMPHS ABS: 1403 {cells}/uL (ref 850–3900)
MCH: 29.4 pg (ref 27.0–33.0)
MCHC: 32.9 g/dL (ref 32.0–36.0)
MCV: 89.4 fL (ref 80.0–100.0)
MPV: 12.7 fL — ABNORMAL HIGH (ref 7.5–12.5)
Monocytes Relative: 9.3 %
NEUTROS ABS: 3060 {cells}/uL (ref 1500–7800)
Neutrophils Relative %: 60 %
Platelets: 261 10*3/uL (ref 140–400)
RBC: 5.17 10*6/uL — AB (ref 3.80–5.10)
RDW: 12.3 % (ref 11.0–15.0)
Total Lymphocyte: 27.5 %
WBC mixed population: 474 cells/uL (ref 200–950)
WBC: 5.1 10*3/uL (ref 3.8–10.8)

## 2017-05-17 LAB — LIPID PANEL
CHOL/HDL RATIO: 4.4 (calc) (ref <5.0)
Cholesterol: 265 mg/dL — ABNORMAL HIGH (ref <200)
HDL: 60 mg/dL (ref >50)
LDL CHOLESTEROL (CALC): 181 mg/dL — AB
Non-HDL Cholesterol (Calc): 205 mg/dL (calc) — ABNORMAL HIGH (ref <130)
TRIGLYCERIDES: 110 mg/dL (ref <150)

## 2017-05-17 LAB — VITAMIN B12: VITAMIN B 12: 650 pg/mL (ref 200–1100)

## 2017-05-17 LAB — TSH: TSH: 2.5 m[IU]/L (ref 0.40–4.50)

## 2017-05-17 LAB — VITAMIN D 25 HYDROXY (VIT D DEFICIENCY, FRACTURES): VIT D 25 HYDROXY: 20 ng/mL — AB (ref 30–100)

## 2017-05-19 ENCOUNTER — Other Ambulatory Visit: Payer: Self-pay | Admitting: Family Medicine

## 2017-05-19 DIAGNOSIS — E782 Mixed hyperlipidemia: Secondary | ICD-10-CM

## 2017-05-19 DIAGNOSIS — Z5181 Encounter for therapeutic drug level monitoring: Secondary | ICD-10-CM

## 2017-05-19 MED ORDER — ATORVASTATIN CALCIUM 20 MG PO TABS: 20.0000 mg | ORAL_TABLET | Freq: Every day | ORAL | 1 refills | Status: DC

## 2017-05-19 MED ORDER — VITAMIN D (ERGOCALCIFEROL) 1.25 MG (50000 UNIT) PO CAPS: 50000.0000 [IU] | ORAL_CAPSULE | ORAL | 1 refills | Status: AC

## 2017-05-19 NOTE — Progress Notes (Signed)
Medicines ordered Labs ordered

## 2017-05-26 ENCOUNTER — Telehealth: Payer: Self-pay | Admitting: Family Medicine

## 2017-05-26 MED ORDER — MELATONIN 3 MG PO TABS: ORAL_TABLET | ORAL | 5 refills | Status: DC

## 2017-05-26 NOTE — Telephone Encounter (Signed)
[facility] informed her of the directions for vitamin d as well as the need and importance of giving melatonin at the same time nightly. [facility] from [facility] per Rodney Boozeasha.

## 2017-05-26 NOTE — Telephone Encounter (Signed)
Copied from CRM #[id number]. Topic: Inquiry >> May 25, 2017 10:18 AM Eston Mouldavis, Cheri B wrote: Reason for CRM: Rodney Boozeasha from [facility] is asking for orders for Mrs [name] to be faxed to [phone] and to [facility]  At [phone] for melatonin and if there is something else she can get to assist the melatonin because pt is not sleeping through the night >> May 25, 2017 10:31 AM Eston Mouldavis, Cheri B wrote: Need to add Vit d to list for Mrs [name] [facility] >> May 25, 2017 10:34 AM Eston Mouldavis, Cheri B wrote: Should pt discontinue vit d in 8 weeks?

## 2017-05-26 NOTE — Telephone Encounter (Signed)
See the orders for vitamin D (ergocalciferol); admin instructions say for 8 weeks, then stop Fax orders as requested --------------------------------------- For sleep, okay to add melatonin 3 mg one by mouth every night at 9 pm; medicine must be given at the SAME TIME every night (not 8 pm one night and 10 pm the next night; the timing is crucial) Thank you

## 2017-05-27 ENCOUNTER — Telehealth: Payer: Self-pay | Admitting: *Deleted

## 2017-05-27 NOTE — Telephone Encounter (Signed)
Copied from CRM [phone]. Topic: Quick Communication - See Telephone Encounter >> May 27, 2017 11:06 AM Eston Mouldavis, Taris Galindo B wrote: CRM for notification. See Telephone encounter for:  05/27/17. 3 rd call concerning

## 2017-05-27 NOTE — Telephone Encounter (Signed)
I received a call from the [facility] facility where this pt resides requesting a fax for the order for Melatonin.   The order was sent to [facility] in [place] by Dr. Sherie DonLada yesterday.   It is ready.  I instructed her to call the pharmacy to get the order faxed to her.   Let her know to call us back if she encountered any problems.   She acknowledged that she would.

## 2017-06-13 ENCOUNTER — Ambulatory Visit (INDEPENDENT_AMBULATORY_CARE_PROVIDER_SITE_OTHER): Payer: Medicare Other | Admitting: Family Medicine

## 2017-06-13 ENCOUNTER — Encounter: Payer: Self-pay | Admitting: Family Medicine

## 2017-06-13 DIAGNOSIS — Z9181 History of falling: Secondary | ICD-10-CM | POA: Diagnosis not present

## 2017-06-13 DIAGNOSIS — E559 Vitamin D deficiency, unspecified: Secondary | ICD-10-CM

## 2017-06-13 DIAGNOSIS — F0390 Unspecified dementia without behavioral disturbance: Secondary | ICD-10-CM

## 2017-06-13 DIAGNOSIS — A630 Anogenital (venereal) warts: Secondary | ICD-10-CM | POA: Diagnosis not present

## 2017-06-13 DIAGNOSIS — R531 Weakness: Secondary | ICD-10-CM

## 2017-06-13 DIAGNOSIS — R718 Other abnormality of red blood cells: Secondary | ICD-10-CM

## 2017-06-13 DIAGNOSIS — Z1231 Encounter for screening mammogram for malignant neoplasm of breast: Secondary | ICD-10-CM | POA: Diagnosis not present

## 2017-06-13 DIAGNOSIS — R2681 Unsteadiness on feet: Secondary | ICD-10-CM

## 2017-06-13 DIAGNOSIS — Z1239 Encounter for other screening for malignant neoplasm of breast: Secondary | ICD-10-CM

## 2017-06-13 DIAGNOSIS — R634 Abnormal weight loss: Secondary | ICD-10-CM

## 2017-06-13 NOTE — Assessment & Plan Note (Signed)
Reviewed exposure to light with daughter; will continue Rx vit D for 8 weeks total, then OTC 1000 iu daily

## 2017-06-13 NOTE — Assessment & Plan Note (Signed)
Only elevated seven hundredths of a point, but new for her; check pillow at facility to see if positional issue; if still elevated on recheck, daughter will consider a sleep study; we're not sure if she would tolerate CPAP if indicated

## 2017-06-13 NOTE — Assessment & Plan Note (Addendum)
Keep her observed and safe; memory unit; on medicine, thyroid and B12 have been checked; daughter was not interested in referring to neurologist at this time

## 2017-06-13 NOTE — Assessment & Plan Note (Signed)
Refer to OT and PT

## 2017-06-13 NOTE — Assessment & Plan Note (Signed)
Refer for PT and OT; evaluate facility and see if accomodations, assistive devices would be helpful to reduce falls

## 2017-06-13 NOTE — Assessment & Plan Note (Signed)
Refer to PT and OT

## 2017-06-13 NOTE — Assessment & Plan Note (Signed)
Resolved per daughter; [facility]

## 2017-06-13 NOTE — Assessment & Plan Note (Addendum)
Resolved; discussed issues not uncommon in the geriatric population and residents of facilities that can result in weight loss (denture issues, taste/saltiness of food, reminders to eat, other residents taking food, pacing in the hallways, etc.); also discussed cancer as an issue; patient is DNR but daughter thinks at least screening for breast cancer would be okay (mammo ordered); thyroid testing was normal; boost or ensure if not eating 50-75% of meals; reassess in 3 months

## 2017-06-13 NOTE — Progress Notes (Signed)
BP 124/72   Pulse 77   Temp 98.7 F (37.1 C) (Oral)   Resp 14   Wt 128 lb (58.1 kg)   SpO2 95%   BMI 25.94 kg/m    Subjective:    Patient ID: Felicia [name], female    DOB: Aug 21, 1927, 83 y.o.   MRN: [id number]  HPI: Felicia GarbeFlora S Kasal is a 83 y.o. female  Chief Complaint  Patient presents with  . Follow-up  . Weight Loss    has gained 8 pds since last visit    HPI Patient is here with her daughter Patient moved to the new facility Was seen in May and then moved from [place] to new facility on Sept 28th She was seen on October 15th; weight that day was 10 pounds down from the previous visit Her schedule changed; meals were on a different time at the new facility Bowels are okay Nice teeth, just saw the dentist and no problems; no dentures She has not had any problems with her oral hygiene Nothing hurts; no abdominal pain, no oral pain No constant walking up and down the hallways Melatonin is helping with sleep Daughter is working with the staff to help with behaviors around eating and bedtime Mornings are the hardest for her; staff helps her get dressed and to the dining room She has a rocking chair over there; sits by a window in the living room with nice natural sunlight, not getting outside as much [facility]; 4-5 hours a day, 18 hours a week pretty much; they worked that out over there Daughter asked about PT and OT; fall risk; unsteady She thinks her balance is off a little; no grab bars at the facility; halls are kind of dark She has a hx of condyloma accuminata; resolve on its own; saw urologist and he couldn't treat it, [facility], [facility]; nothing was working, then later her body got rid of it Talked about mammogram; patient's daughter thinks she would be willing to have a mammogram Talked about RBC; not sure about size of pillow or snoring; daughter will consider sleep study  [facility] 2/9  05/16/2017 12/01/2016 02/26/2016 03/21/2015  Decreased Interest 0 0 0 0  Down, Depressed, Hopeless 0 0 0 0  PHQ - 2 Score 0 0 0 0    Relevant past medical, surgical, family and social history reviewed Past Medical History:  Diagnosis Date  . Condyloma acuminatum   . Dementia   . Hyperlipidemia   . Hypertension   . Osteoporosis   . Post-menopausal   . Rosacea   . Thalamic infarction ([facility])   . Urinary retention    chronic  . Vitamin D deficiency disease    Past Surgical History:  Procedure Laterality Date  . SUPRAPUBIC CATHETER PLACEMENT  Nov. 2014   Family History  Problem Relation Age of Onset  . Arthritis Mother   . CAD Father   . Arthritis Father   . Arthritis Sister   . Arthritis Brother   . Arthritis Daughter   . Arthritis Son   . Ovarian cancer Neg Hx    Social History   Tobacco Use  . Smoking status: Never Smoker  . Smokeless tobacco: Never Used  Substance Use Topics  . Alcohol use: No  . Drug use: No   Interim medical history since last visit reviewed. Allergies and medications reviewed  Review of Systems Per HPI unless specifically  indicated above      Objective:    BP 124/72   Pulse 77   Temp 98.7 F (37.1 C) (Oral)   Resp 14   Wt 128 lb (58.1 kg)   SpO2 95%   BMI 25.94 kg/m   Wt Readings from Last 3 Encounters:  06/13/17 128 lb (58.1 kg)  05/16/17 119 lb 8 oz (54.2 kg)  12/01/16 129 lb 12.8 oz (58.9 kg)    Physical Exam  Constitutional: She appears well-developed and well-nourished.  HENT:  Mouth/Throat: Mucous membranes are normal.  Eyes: EOM are normal. No scleral icterus.  Cardiovascular: Normal rate and regular rhythm.  Pulmonary/Chest: Effort normal and breath sounds normal.  Psychiatric: She has a normal mood and affect. Her mood appears not anxious. She is not agitated, not aggressive, not slowed and not withdrawn. She does not exhibit a depressed mood.  Pleasant, smiling; poor historian   Results for orders placed or  performed in visit on 05/16/17  CBC with Differential/Platelet  Result Value Ref Range   WBC 5.1 3.8 - 10.8 Thousand/uL   RBC 5.17 (H) 3.80 - 5.10 Million/uL   Hemoglobin 15.2 11.7 - 15.5 g/dL   HCT 16.1 (H) 09.6 - 04.5 %   MCV 89.4 80.0 - 100.0 fL   MCH 29.4 27.0 - 33.0 pg   MCHC 32.9 32.0 - 36.0 g/dL   RDW 40.9 81.1 - 91.4 %   Platelets 261 140 - 400 Thousand/uL   MPV 12.7 (H) 7.5 - 12.5 fL   Neutro Abs 3,060 1,500 - 7,800 cells/uL   Lymphs Abs 1,403 850 - 3,900 cells/uL   WBC mixed population 474 200 - 950 cells/uL   Eosinophils Absolute 133 15 - 500 cells/uL   Basophils Absolute 31 0 - 200 cells/uL   Neutrophils Relative % 60 %   Total Lymphocyte 27.5 %   Monocytes Relative 9.3 %   Eosinophils Relative 2.6 %   Basophils Relative 0.6 %  COMPLETE METABOLIC PANEL WITH GFR  Result Value Ref Range   Glucose, Bld 84 65 - 99 mg/dL   BUN 11 7 - 25 mg/dL   Creat 7.82 9.56 - 2.13 mg/dL   GFR, Est Non African American 78 > OR = 60 mL/min/1.43m2   GFR, Est African American 90 > OR = 60 mL/min/1.26m2   BUN/Creatinine Ratio NOT APPLICABLE 6 - 22 (calc)   Sodium 141 135 - 146 mmol/L   Potassium 4.2 3.5 - 5.3 mmol/L   Chloride 103 98 - 110 mmol/L   CO2 31 20 - 32 mmol/L   Calcium 9.4 8.6 - 10.4 mg/dL   Total Protein 6.3 6.1 - 8.1 g/dL   Albumin 3.9 3.6 - 5.1 g/dL   Globulin 2.4 1.9 - 3.7 g/dL (calc)   AG Ratio 1.6 1.0 - 2.5 (calc)   Total Bilirubin 0.6 0.2 - 1.2 mg/dL   Alkaline phosphatase (APISO) 61 33 - 130 U/L   AST 15 10 - 35 U/L   ALT 7 6 - 29 U/L  Lipid panel  Result Value Ref Range   Cholesterol 265 (H) <200 mg/dL   HDL 60 >08 mg/dL   Triglycerides 657 <846 mg/dL   LDL Cholesterol (Calc) 181 (H) mg/dL (calc)   Total CHOL/HDL Ratio 4.4 <5.0 (calc)   Non-HDL Cholesterol (Calc) 205 (H) <130 mg/dL (calc)  TSH  Result Value Ref Range   TSH 2.50 0.40 - 4.50 mIU/L  B12  Result Value Ref Range   Vitamin B-12 650 200 -  1,100 pg/mL  VITAMIN D 25 Hydroxy (Vit-D Deficiency,  Fractures)  Result Value Ref Range   Vit D, 25-Hydroxy 20 (L) 30 - 100 ng/mL      Assessment & Plan:   Problem List Items Addressed This Visit      Nervous and Auditory   Dementia    Keep her observed and safe; memory unit; on medicine, thyroid and B12 have been checked; daughter was not interested in referring to neurologist at this time        Musculoskeletal and Integument   Condyloma acuminatum    Resolved per daughter; [facility]        Other   Weight loss    Resolved; discussed issues not uncommon in the geriatric population and residents of facilities that can result in weight loss (denture issues, taste/saltiness of food, reminders to eat, other residents taking food, pacing in the hallways, etc.); also discussed cancer as an issue; patient is DNR but daughter thinks at least screening for breast cancer would be okay (mammo ordered); thyroid testing was normal; boost or ensure if not eating 50-75% of meals; reassess in 3 months      Weakness    Refer for PT and OT; evaluate facility and see if accomodations, assistive devices would be helpful to reduce falls      Vitamin D deficiency disease    Reviewed exposure to light with daughter; will continue Rx vit D for 8 weeks total, then OTC 1000 iu daily      Generalized weakness    Refer to PT and OT      Relevant Orders   Ambulatory referral to Occupational Therapy   Gait instability    Refer to OT and PT      Relevant Orders   Ambulatory referral to Occupational Therapy   Elevated red blood cell count    Only elevated seven hundredths of a point, but new for her; check pillow at facility to see if positional issue; if still elevated on recheck, daughter will consider a sleep study; we're not sure if she would tolerate CPAP if indicated      Breast cancer screening    Discussed routine screening      Relevant Orders   MM Digital Screening   At moderate risk for fall     Referral already put in for PT; would like OT to evaluate as well, make recommendations for railings, shower chairs, walker, etc, everything to help prevent a fall      Relevant Orders   Ambulatory referral to Occupational Therapy       Follow up plan: Return in about 3 months (around 09/13/2017).  An after-visit summary was printed and given to the patient at check-out.  Please see the patient instructions which may contain other information and recommendations beyond what is mentioned above in the assessment and plan.  No orders of the defined types were placed in this encounter.   Orders Placed This Encounter  Procedures  . MM Digital Screening  . Ambulatory referral to Occupational Therapy

## 2017-06-13 NOTE — Assessment & Plan Note (Signed)
Discussed routine screening

## 2017-06-13 NOTE — Patient Instructions (Addendum)
Try a flat pillow on her bed Recheck labs on or shortly after November 30th (fasting, so no calories after midnight for a morning blood draw) Think about a sleep study and let me know if you'd like me to proceed We can add Boost or Ensure if she is not consuming at least 50-75% of her meals

## 2017-06-13 NOTE — Assessment & Plan Note (Signed)
Referral already put in for PT; would like OT to evaluate as well, make recommendations for railings, shower chairs, walker, etc, everything to help prevent a fall

## 2017-06-16 ENCOUNTER — Telehealth: Payer: Self-pay

## 2017-06-16 NOTE — Telephone Encounter (Signed)
Patient's referral was sent to [facility].

## 2017-06-16 NOTE — Telephone Encounter (Signed)
-----   Message from Kerman PasseyMelinda P Lada, MD sent at 06/15/2017 12:15 PM EST ----- Regarding: patient lives in a facility Felicia [name], please arrange for PT/OT eval, but be aware that patient lives in a facility; thank you  ----- Message ----- From: Rosana FretMcPherson, Britta P Sent: 06/15/2017   9:29 AM To: Kerman PasseyMelinda P Lada, MD Subject: Questions about outpaitent PT/OT referral in#  Good morning Dr Sherie DonLada,   We are writing regarding the above patient. We had a referral in the WQ for PT/OT. When we looked at the details it stated to eval patient and living facility. Upon a chart review it looks as though the patient lives in a facility. We are solely outpatient and do not go to facilities nor the home. Based on the note it states:  Referral already put in for PT; would like OT to evaluate as well, make recommendations for railings, shower chairs, walker, etc, everything to help prevent a fall Refer for PT and OT; evaluate facility and see if accomodations, assistive devices would be helpful to reduce falls  We were wondering if this was sent to us in error. Can you please confirm? If the patient lives in a facility she cannot have outpatient services.  Thanks, [employer] GlobalBritta

## 2017-06-16 NOTE — Telephone Encounter (Deleted)
-----   Message from Melinda P Lada, MD sent at 06/15/2017 12:15 PM EST ----- Regarding: patient lives in a facility Felicia [name], please arrange for PT/OT eval, but be aware that patient lives in a facility; thank you  ----- Message ----- From: McPherson, Britta P Sent: 06/15/2017   9:29 AM To: Melinda P Lada, MD Subject: Questions about outpaitent PT/OT referral in#  Good morning Dr Lada,   We are writing regarding the above patient. We had a referral in the WQ for PT/OT. When we looked at the details it stated to eval patient and living facility. Upon a chart review it looks as though the patient lives in a facility. We are solely outpatient and do not go to facilities nor the home. Based on the note it states:  Referral already put in for PT; would like OT to evaluate as well, make recommendations for railings, shower chairs, walker, etc, everything to help prevent a fall Refer for PT and OT; evaluate facility and see if accomodations, assistive devices would be helpful to reduce falls  We were wondering if this was sent to us in error. Can you please confirm? If the patient lives in a facility she cannot have outpatient services.  Thanks, Britta   

## 2017-06-28 ENCOUNTER — Other Ambulatory Visit: Payer: Self-pay | Admitting: Family Medicine

## 2017-06-28 ENCOUNTER — Other Ambulatory Visit: Payer: Self-pay

## 2017-06-28 DIAGNOSIS — R4182 Altered mental status, unspecified: Secondary | ICD-10-CM

## 2017-06-28 DIAGNOSIS — E782 Mixed hyperlipidemia: Secondary | ICD-10-CM

## 2017-06-28 DIAGNOSIS — R718 Other abnormality of red blood cells: Secondary | ICD-10-CM

## 2017-06-28 DIAGNOSIS — Z5181 Encounter for therapeutic drug level monitoring: Secondary | ICD-10-CM

## 2017-06-28 LAB — LIPID PANEL
CHOLESTEROL: 134 mg/dL (ref <200)
HDL: 59 mg/dL (ref >50)
LDL Cholesterol (Calc): 60 mg/dL (calc)
NON-HDL CHOLESTEROL (CALC): 75 mg/dL (ref <130)
TRIGLYCERIDES: 72 mg/dL (ref <150)
Total CHOL/HDL Ratio: 2.3 (calc) (ref <5.0)

## 2017-06-28 LAB — CBC WITH DIFFERENTIAL/PLATELET
BASOS ABS: 43 {cells}/uL (ref 0–200)
Basophils Relative: 0.7 %
EOS PCT: 1.6 %
Eosinophils Absolute: 98 cells/uL (ref 15–500)
HCT: 42.1 % (ref 35.0–45.0)
Hemoglobin: 14.2 g/dL (ref 11.7–15.5)
LYMPHS ABS: 1562 {cells}/uL (ref 850–3900)
MCH: 30 pg (ref 27.0–33.0)
MCHC: 33.7 g/dL (ref 32.0–36.0)
MCV: 89 fL (ref 80.0–100.0)
MONOS PCT: 10.2 %
MPV: 13.2 fL — ABNORMAL HIGH (ref 7.5–12.5)
NEUTROS ABS: 3776 {cells}/uL (ref 1500–7800)
NEUTROS PCT: 61.9 %
PLATELETS: 239 10*3/uL (ref 140–400)
RBC: 4.73 10*6/uL (ref 3.80–5.10)
RDW: 12.3 % (ref 11.0–15.0)
Total Lymphocyte: 25.6 %
WBC mixed population: 622 cells/uL (ref 200–950)
WBC: 6.1 10*3/uL (ref 3.8–10.8)

## 2017-06-28 LAB — ALT: ALT: 6 U/L (ref 6–29)

## 2017-06-28 NOTE — Progress Notes (Signed)
Caregiver says her behavior is a little different; would like urine checked; order entered

## 2017-06-29 ENCOUNTER — Telehealth: Payer: Self-pay

## 2017-06-29 ENCOUNTER — Other Ambulatory Visit: Payer: Self-pay | Admitting: Family Medicine

## 2017-06-29 MED ORDER — DOXYCYCLINE HYCLATE 100 MG PO TABS: 100.0000 mg | ORAL_TABLET | Freq: Two times a day (BID) | ORAL | 0 refills | Status: DC

## 2017-06-29 NOTE — Progress Notes (Signed)
Start antibiotics asap

## 2017-06-29 NOTE — Telephone Encounter (Signed)
Called pt's daughter (same number as pt's) no answer. Unable to leave message as voicemail is full. CRM created.

## 2017-06-29 NOTE — Telephone Encounter (Signed)
-----   Message from Kerman PasseyMelinda P Lada, MD sent at 06/29/2017 11:55 AM EST ----- Please inform patient's daughter that the liver test is normal; patient's cholesterol has come down beautifully; thank you

## 2017-06-30 ENCOUNTER — Telehealth: Payer: Self-pay

## 2017-06-30 NOTE — Telephone Encounter (Signed)
Spoke with patient's son Maylene RoesJohn Harter and he states his [facility] is [facility]. The son took the prescription to his mother new home and they are able to give the prescription to her. But the son also wanted to inquire about otc Aleve and Tylenol prescription since she is in a home.

## 2017-06-30 NOTE — Telephone Encounter (Signed)
Okay for acetaminophen 500 mg one by mouth every six hours PRN

## 2017-07-02 LAB — URINALYSIS W MICROSCOPIC + REFLEX CULTURE
BACTERIA UA: NONE SEEN /HPF
Bilirubin Urine: NEGATIVE
Glucose, UA: NEGATIVE
HYALINE CAST: NONE SEEN /LPF
Hgb urine dipstick: NEGATIVE
KETONES UR: NEGATIVE
Nitrites, Initial: NEGATIVE
PH: 7 (ref 5.0–8.0)
Protein, ur: NEGATIVE
RBC / HPF: NONE SEEN /HPF (ref 0–2)
SPECIFIC GRAVITY, URINE: 1.013 (ref 1.001–1.03)
Squamous Epithelial / LPF: NONE SEEN /HPF (ref <=5)

## 2017-07-02 LAB — URINE CULTURE
MICRO NUMBER: 81341780
SPECIMEN QUALITY: ADEQUATE
Sample Source: 0

## 2017-07-02 LAB — CULTURE INDICATED

## 2017-07-09 ENCOUNTER — Emergency Department
Admission: EM | Admit: 2017-07-09 | Discharge: 2017-07-09 | Disposition: A | Discharge status: Home / Self Care | Payer: Medicare Other | Attending: Emergency Medicine | Admitting: Emergency Medicine

## 2017-07-09 ENCOUNTER — Other Ambulatory Visit: Payer: Self-pay

## 2017-07-09 ENCOUNTER — Encounter: Payer: Self-pay | Admitting: Emergency Medicine

## 2017-07-09 DIAGNOSIS — F0390 Unspecified dementia without behavioral disturbance: Secondary | ICD-10-CM | POA: Diagnosis not present

## 2017-07-09 DIAGNOSIS — Z139 Encounter for screening, unspecified: Secondary | ICD-10-CM | POA: Diagnosis not present

## 2017-07-09 DIAGNOSIS — R339 Retention of urine, unspecified: Secondary | ICD-10-CM | POA: Insufficient documentation

## 2017-07-09 LAB — CBC WITH DIFFERENTIAL/PLATELET
Basophils Absolute: 0 10*3/uL (ref 0–0.1)
Basophils Relative: 0 %
Eosinophils Absolute: 0.1 10*3/uL (ref 0–0.7)
Eosinophils Relative: 2 %
HCT: 40.6 % (ref 35.0–47.0)
HEMOGLOBIN: 13.8 g/dL (ref 12.0–16.0)
LYMPHS ABS: 1.6 10*3/uL (ref 1.0–3.6)
LYMPHS PCT: 23 %
MCH: 30.4 pg (ref 26.0–34.0)
MCHC: 33.9 g/dL (ref 32.0–36.0)
MCV: 89.5 fL (ref 80.0–100.0)
Monocytes Absolute: 0.7 10*3/uL (ref 0.2–0.9)
Monocytes Relative: 10 %
NEUTROS ABS: 4.6 10*3/uL (ref 1.4–6.5)
NEUTROS PCT: 65 %
Platelets: 222 10*3/uL (ref 150–440)
RBC: 4.53 MIL/uL (ref 3.80–5.20)
RDW: 13.3 % (ref 11.5–14.5)
WBC: 7.1 10*3/uL (ref 3.6–11.0)

## 2017-07-09 LAB — COMPREHENSIVE METABOLIC PANEL
ALK PHOS: 66 U/L (ref 38–126)
ALT: 9 U/L — AB (ref 14–54)
AST: 20 U/L (ref 15–41)
Albumin: 3.6 g/dL (ref 3.5–5.0)
Anion gap: 7 (ref 5–15)
BUN: 19 mg/dL (ref 6–20)
CALCIUM: 9.4 mg/dL (ref 8.9–10.3)
CO2: 28 mmol/L (ref 22–32)
CREATININE: 0.72 mg/dL (ref 0.44–1.00)
Chloride: 104 mmol/L (ref 101–111)
Glucose, Bld: 116 mg/dL — ABNORMAL HIGH (ref 65–99)
Potassium: 3.3 mmol/L — ABNORMAL LOW (ref 3.5–5.1)
Sodium: 139 mmol/L (ref 135–145)
Total Bilirubin: 0.7 mg/dL (ref 0.3–1.2)
Total Protein: 6.4 g/dL — ABNORMAL LOW (ref 6.5–8.1)

## 2017-07-09 LAB — URINALYSIS, COMPLETE (UACMP) WITH MICROSCOPIC
BACTERIA UA: NONE SEEN
Bilirubin Urine: NEGATIVE
Glucose, UA: NEGATIVE mg/dL
Hgb urine dipstick: NEGATIVE
Ketones, ur: NEGATIVE mg/dL
Leukocytes, UA: NEGATIVE
Nitrite: NEGATIVE
Protein, ur: NEGATIVE mg/dL
SPECIFIC GRAVITY, URINE: 1.019 (ref 1.005–1.030)
pH: 5 (ref 5.0–8.0)

## 2017-07-09 NOTE — ED Notes (Signed)
Standley DakinsSherry Reha called to give a phone number of the facility where the patient stays; name of the [facility] and their phone number is [phone]; will call them and give them the discharge report. Per Cordelia PenSherry, pt needs to be transported via EMS.

## 2017-07-09 NOTE — ED Notes (Signed)
Called back [facility]; staff at facility informed this RN that patient will be picked up by Felicia DeanPhyllis Chavis in a blue Jeep.

## 2017-07-09 NOTE — ED Notes (Signed)
Discharge instructions reviewed with care giver that came to pick up patient; pt discharged in care of Naomie Deanhyllis Chavis from the facility. Caregiver verbalized understanding of the discharge instructions; pt was able to ambulate out of the ED. E-Signature signed by caregiver.

## 2017-07-09 NOTE — ED Notes (Addendum)
[facility] and spoke with staff to inform them the patient is being discharged; staff member said that they will call back regarding transport of the patient.

## 2017-07-09 NOTE — ED Triage Notes (Signed)
Pt [facility] ED 08 [facility] with c/o urinary [place]; per EMS pt has not been able to urinate for the past 24 hours per family members; pt has history of the same, and medical history of dementia, hypertension, hyperlipidemia, and osteoporosis; pt is awake, alert, oriented x1, pt is oriented to self only. Pt denies any pain at this time.

## 2017-07-09 NOTE — ED Notes (Signed)
Attempted to Call Daughter Standley DakinsSherry Faye at her cell phone and home phone numbers (), [facility] to [facility], left voicemail asking her [facility] ED and ask for this RN; then called the phone number found on the paperwork that came with the patient, that number belongs to answering service for [facility], they were unable to provide any further information about the facility where the patient came from. Google search for name of the facility that EMS provided did not prove to be successful.

## 2017-07-09 NOTE — ED Provider Notes (Signed)
[facility] Emergency Department Provider Note       Time seen: ----------------------------------------- 7:23 PM on 07/09/2017 -----------------------------------------  Level V caveat: History/ROS limited by dementia I have reviewed the triage vital signs and the nursing notes.  HISTORY   Chief Complaint No chief complaint on file.    HPI Felicia [name] is a 83 y.o. female with a history of dementia, hypertension, hyperlipidemia and CVA who presents to the ED for urinary retention.  According to her care home she has not urinated since yesterday.  She has had a history of urinary retention requiring Foley catheter in the past.  Patient does describe some pelvic discomfort but otherwise denies any complaints at this time.  Past Medical History:  Diagnosis Date  . Condyloma acuminatum   . Dementia   . Hyperlipidemia   . Hypertension   . Osteoporosis   . Post-menopausal   . Rosacea   . Thalamic infarction (HCC)   . Urinary retention    chronic  . Vitamin D deficiency disease     Patient Active Problem List   Diagnosis Date Noted  . Elevated red blood cell count 06/13/2017  . Weight loss 05/16/2017  . Gait instability 05/16/2017  . At moderate risk for fall 02/26/2016  . DNAR (do not attempt resuscitation) 10/15/2015  . Weakness 10/15/2015  . Generalized weakness 09/21/2015  . Medication monitoring encounter 03/21/2015  . Breast cancer screening 03/21/2015  . Rosacea   . Hyperlipidemia   . Hypertension   . Osteoporosis   . Dementia   . Vitamin D deficiency disease   . Condyloma acuminatum   . Thalamic infarction [facility](HCC)     Past Surgical History:  Procedure Laterality Date  . SUPRAPUBIC CATHETER PLACEMENT  Nov. 2014    Allergies Patient has no known allergies.  Social History Social History   Tobacco Use  . Smoking status: Never Smoker  . Smokeless tobacco: Never Used  Substance Use Topics  . Alcohol use: No  . Drug use: No     Review of Systems Unknown, decreased urinary output and suprapubic pain  All systems negative/normal/unremarkable except as stated in the HPI  ____________________________________________   PHYSICAL EXAM:  VITAL SIGNS: ED Triage Vitals  Enc Vitals Group     BP      Pulse      Resp      Temp      Temp src      SpO2      Weight      Height      Head Circumference      Peak Flow      Pain Score      Pain Loc      Pain Edu?      Excl. in GC?     Constitutional: Alert but disoriented, well appearing and in no distress. Eyes: Conjunctivae are normal. Normal extraocular movements. ENT   Head: Normocephalic and atraumatic.   Nose: No congestion/rhinnorhea.   Mouth/Throat: Mucous membranes are moist.   Neck: No stridor. Cardiovascular: Normal rate, regular rhythm. No murmurs, rubs, or gallops. Respiratory: Normal respiratory effort without tachypnea nor retractions. Breath sounds are clear and equal bilaterally. No wheezes/rales/rhonchi. Gastrointestinal: Mild suprapubic tenderness, no rebound or guarding.  Normal bowel sounds. Musculoskeletal: Nontender with normal range of motion in extremities. No lower extremity tenderness nor edema. Neurologic:  Normal speech and language. No gross focal neurologic deficits are appreciated.  Skin:  Skin is warm, dry and intact. No  rash noted. Psychiatric: Mood and affect are normal. Speech and behavior are normal.  ____________________________________________  ED COURSE:  Pertinent labs & imaging results that were available during my care of the patient were reviewed by me and considered in my medical decision making (see chart for details). Patient presents for urinary retention, we will assess with labs and imaging as indicated. Clinical Course as of Jul 09 2006  Sat Jul 09, 2017  1943 Patient had in and out catheterization with only 250 cc of urine  [JW]    Clinical Course User Index [JW] Emily FilbertWilliams, Jonathan E, MD    Procedures ____________________________________________   LABS (pertinent positives/negatives)  Labs Reviewed  URINALYSIS, COMPLETE (UACMP) WITH MICROSCOPIC - Abnormal; Notable for the following components:      Result Value   Color, Urine YELLOW (*)    APPearance CLEAR (*)    Squamous Epithelial / LPF 0-5 (*)    All other components within normal limits  COMPREHENSIVE METABOLIC PANEL - Abnormal; Notable for the following components:   Potassium 3.3 (*)    Glucose, Bld 116 (*)    Total Protein 6.4 (*)    ALT 9 (*)    All other components within normal limits  URINE CULTURE  CBC WITH DIFFERENTIAL/PLATELET  ____________________________________________  DIFFERENTIAL DIAGNOSIS   Urinary retention, UTI, renal failure, electrolyte abnormality, bladder prolapse  FINAL ASSESSMENT AND PLAN  Medical screening exam   Plan: Patient had presented for decreased urine output for the last 24 hours. Patient's labs do not reflect any UTI or renal failure. Patient's imaging revealed only 250 cc of urine in her bladder.  She does not appear to be retaining urine which is the reason for which she was sent to the ER.  Patient is very comfortable and has no complaints at this time.     Emily FilbertWilliams, Jonathan E, MD   Note: This note was generated in part or whole with voice recognition software. Voice recognition is usually quite accurate but there are transcription errors that can and very often do occur. I apologize for any typographical errors that were not detected and corrected.     Emily FilbertWilliams, Jonathan E, MD 07/09/17 2007

## 2017-07-09 NOTE — ED Notes (Signed)

## 2017-07-11 ENCOUNTER — Other Ambulatory Visit: Payer: Self-pay | Admitting: Family Medicine

## 2017-07-11 DIAGNOSIS — Z66 Do not resuscitate: Secondary | ICD-10-CM

## 2017-07-11 LAB — URINE CULTURE
Culture: NO GROWTH
Special Requests: NORMAL

## 2017-07-13 ENCOUNTER — Other Ambulatory Visit: Payer: Self-pay | Admitting: Family Medicine

## 2017-10-10 ENCOUNTER — Other Ambulatory Visit: Payer: Self-pay | Admitting: Family Medicine

## 2017-11-10 ENCOUNTER — Other Ambulatory Visit: Payer: Self-pay | Admitting: Family Medicine

## 2017-12-10 ENCOUNTER — Other Ambulatory Visit: Payer: Self-pay | Admitting: Family Medicine

## 2017-12-12 ENCOUNTER — Other Ambulatory Visit: Payer: Self-pay | Admitting: Family Medicine

## 2017-12-12 NOTE — Telephone Encounter (Signed)
I already approved a year's worth on 11/10/17; please resolve with pharmacy

## 2017-12-12 NOTE — Telephone Encounter (Signed)
Called into pharmacy

## 2018-01-10 ENCOUNTER — Other Ambulatory Visit: Payer: Self-pay | Admitting: Family Medicine

## 2018-02-06 ENCOUNTER — Other Ambulatory Visit: Payer: Self-pay | Admitting: Family Medicine

## 2018-02-14 ENCOUNTER — Other Ambulatory Visit: Payer: Self-pay | Admitting: Family Medicine

## 2018-02-20 ENCOUNTER — Telehealth: Payer: Self-pay

## 2018-02-20 NOTE — Telephone Encounter (Signed)
Done, pt will be seen tomorrow

## 2018-02-20 NOTE — Telephone Encounter (Signed)
Will you do or does she need appt?

## 2018-02-20 NOTE — Telephone Encounter (Signed)
Copied from CRM [phone]. Topic: General - Other >> Feb 20, 2018 12:04 PM Gerrianne ScalePayne, Angela L wrote: Reason for CRM: pt daughter Anise SalvoSherri Byus [phone] states that [facility]  (P) [phone] and [facility] would [facility] the pt will be moving to the facility on 03-02-18 pt Daughter does not have the fax number [facility] need an updated FL2 of current medications

## 2018-02-20 NOTE — Telephone Encounter (Signed)
She needs an appointment; thank you

## 2018-02-21 ENCOUNTER — Ambulatory Visit: Payer: Medicare Other | Admitting: Family Medicine

## 2018-02-21 ENCOUNTER — Ambulatory Visit (INDEPENDENT_AMBULATORY_CARE_PROVIDER_SITE_OTHER): Payer: Medicare Other

## 2018-02-21 ENCOUNTER — Encounter: Payer: Self-pay | Admitting: Family Medicine

## 2018-02-21 VITALS — BP 110/70 | HR 60 | Temp 98.0°F | Resp 12 | Ht 59.0 in | Wt 123.6 lb

## 2018-02-21 VITALS — BP 110/70 | HR 60 | Temp 98.0°F | Resp 12 | Ht 59.0 in | Wt 123.0 lb

## 2018-02-21 DIAGNOSIS — Z23 Encounter for immunization: Secondary | ICD-10-CM | POA: Diagnosis not present

## 2018-02-21 DIAGNOSIS — E782 Mixed hyperlipidemia: Secondary | ICD-10-CM

## 2018-02-21 DIAGNOSIS — Z0000 Encounter for general adult medical examination without abnormal findings: Secondary | ICD-10-CM | POA: Diagnosis not present

## 2018-02-21 DIAGNOSIS — I10 Essential (primary) hypertension: Secondary | ICD-10-CM

## 2018-02-21 DIAGNOSIS — F0390 Unspecified dementia without behavioral disturbance: Secondary | ICD-10-CM | POA: Diagnosis not present

## 2018-02-21 DIAGNOSIS — M810 Age-related osteoporosis without current pathological fracture: Secondary | ICD-10-CM

## 2018-02-21 DIAGNOSIS — E559 Vitamin D deficiency, unspecified: Secondary | ICD-10-CM

## 2018-02-21 DIAGNOSIS — Z111 Encounter for screening for respiratory tuberculosis: Secondary | ICD-10-CM

## 2018-02-21 DIAGNOSIS — Z5181 Encounter for therapeutic drug level monitoring: Secondary | ICD-10-CM

## 2018-02-21 NOTE — Assessment & Plan Note (Signed)
Controlled today 

## 2018-02-21 NOTE — Assessment & Plan Note (Signed)
Calcium through food is best; one study did not show benefit with added pills; order DEXA

## 2018-02-21 NOTE — Progress Notes (Signed)
BP 110/70   Pulse 60   Temp 98 F (36.7 C) (Oral)   Resp 12   Ht 4\' 11"  (1.499 m)   Wt 123 lb (55.8 kg)   SpO2 98%   BMI 24.84 kg/m    Subjective:    Patient ID: Felicia GarbeFlora S Petrosky, female    DOB: March 08, 1928, 84 y.o.   MRN: [id number]  HPI: Felicia GarbeFlora S Gowell is a 84 y.o. female  Chief Complaint  Patient presents with  . Follow-up    fl2    HPI Patient is here with her daughter to get a new FL-2 filled out They are going to switch facilities See FL-2 for information She has dementia; her husband has dementia as well, and they live at the same facility She is not a wanderer She has a hx of stroke, and is on statin; daughter is amenable to getting labs today  Depression screen [facility] 2/9 05/16/2017 12/01/2016 02/26/2016 03/21/2015  Decreased Interest 0 0 0 0  Down, Depressed, Hopeless 0 0 0 0  PHQ - 2 Score 0 0 0 0    Relevant past medical, surgical, family and social history reviewed Past Medical History:  Diagnosis Date  . Condyloma acuminatum   . Dementia   . Hyperlipidemia   . Hypertension   . Osteoporosis   . Post-menopausal   . Rosacea   . Thalamic infarction (HCC)   . Urinary retention    chronic  . Vitamin D deficiency disease    Past Surgical History:  Procedure Laterality Date  . SUPRAPUBIC CATHETER PLACEMENT  Nov. 2014   Family History  Problem Relation Age of Onset  . Arthritis Mother   . CAD Father   . Arthritis Father   . Arthritis Sister   . Arthritis Brother   . Arthritis Daughter   . Arthritis Son   . Ovarian cancer Neg Hx    Social History   Tobacco Use  . Smoking status: Never Smoker  . Smokeless tobacco: Never Used  . Tobacco comment: smoking cessation materials not required  Substance Use Topics  . Alcohol use: No  . Drug use: No    Interim medical history since last visit reviewed. Allergies and medications reviewed  Review of Systems Per HPI unless specifically indicated above     Objective:    BP 110/70   Pulse 60   Temp  98 F (36.7 C) (Oral)   Resp 12   Ht 4\' 11"  (1.499 m)   Wt 123 lb (55.8 kg)   SpO2 98%   BMI 24.84 kg/m   Wt Readings from Last 3 Encounters:  02/21/18 123 lb (55.8 kg)  02/21/18 123 lb 9.6 oz (56.1 kg)  07/09/17 125 lb (56.7 kg)    Physical Exam  Constitutional: She appears well-developed and well-nourished.  HENT:  Mouth/Throat: Mucous membranes are normal.  Eyes: EOM are normal. No scleral icterus.  Cardiovascular: Normal rate and regular rhythm.  Pulmonary/Chest: Effort normal and breath sounds normal.  Neurological:  Poor historian  Skin: No pallor.  Psychiatric: She has a normal mood and affect. Her behavior is normal. Cognition and memory are impaired. She exhibits abnormal recent memory.      Assessment & Plan:   Problem List Items Addressed This Visit      Cardiovascular and Mediastinum   Hypertension    Controlled today        Nervous and Auditory   Dementia    Continue namenda  Musculoskeletal and Integument   Osteoporosis    Calcium through food is best; one study did not show benefit with added pills; order DEXA      Relevant Orders   DG Bone Density     Other   Vitamin D deficiency disease    Check level      Relevant Orders   VITAMIN D 25 Hydroxy (Vit-D Deficiency, Fractures) (Completed)   Medication monitoring encounter    Check liver function      Relevant Orders   CBC with Differential/Platelet (Completed)   COMPLETE METABOLIC PANEL WITH GFR (Completed)   Hyperlipidemia    Check lipids; continue statin      Relevant Orders   Lipid panel (Completed)    Other Visit Diagnoses    Screening-pulmonary TB    -  Primary   Relevant Orders   QuantiFERON-TB Gold Plus (Completed)   Need for Tdap vaccination       Relevant Orders   Tdap vaccine greater than or equal to 7yo IM (Completed)       Follow up plan: Return in about 6 months (around 08/24/2018) for follow-up visit with Dr. Sherie Don (unless being seen by another  physician).  An after-visit summary was printed and given to the patient at check-out.  Please see the patient instructions which may contain other information and recommendations beyond what is mentioned above in the assessment and plan.  No orders of the defined types were placed in this encounter.   Orders Placed This Encounter  Procedures  . DG Bone Density  . Tdap vaccine greater than or equal to 7yo IM  . CBC with Differential/Platelet  . COMPLETE METABOLIC PANEL WITH GFR  . Lipid panel  . VITAMIN D 25 Hydroxy (Vit-D Deficiency, Fractures)  . QuantiFERON-TB Gold Plus

## 2018-02-21 NOTE — Assessment & Plan Note (Signed)
Check liver function 

## 2018-02-21 NOTE — Assessment & Plan Note (Signed)
Check lipids; continue statin 

## 2018-02-21 NOTE — Assessment & Plan Note (Signed)
Check level 

## 2018-02-21 NOTE — Progress Notes (Addendum)
Subjective:   Felicia [name] is a 84 y.o. female who presents for Medicare Annual (Subsequent) preventive examination.  Review of Systems:  N/A Cardiac Risk Factors include: advanced age (>70men, >61 women);dyslipidemia;hypertension;sedentary lifestyle;Other (see comment)(dementia)     Objective:     Vitals: BP 110/70 (BP Location: Right Arm, Patient Position: Sitting, Cuff Size: Normal)   Pulse 60   Temp 98 F (36.7 C) (Oral)   Resp 12   Ht 4\' 11"  (1.499 m)   Wt 123 lb 9.6 oz (56.1 kg)   SpO2 94%   BMI 24.96 kg/m   Body mass index is 24.96 kg/m.  Advanced Directives 02/21/2018 07/09/2017 05/16/2017 12/01/2016 02/26/2016 09/23/2015 09/18/2015  Does Patient Have a Medical Advance Directive? Yes No Yes No Yes Yes No  Type of Estate agent of [place];Living will - - - Living will Out of facility DNR (pink MOST or yellow form) -  Does patient want to make changes to medical advance directive? - - - - - No - Patient declined -  Copy of Healthcare Power of Attorney in Chart? Yes - - - No - copy requested Yes -  Would patient like information on creating a medical advance directive? - - - - - - No - patient declined information    Tobacco Social History   Tobacco Use  Smoking Status Never Smoker  Smokeless Tobacco Never Used  Tobacco Comment   smoking cessation materials not required     Counseling given: No Comment: smoking cessation materials not required  Clinical Intake:  Pre-visit preparation completed: Yes  Pain : No/denies pain   BMI - recorded: 24.96 Nutritional Status: BMI of 19-24  Normal Nutritional Risks: None Diabetes: No  How often do you need to have someone help you when you read instructions, pamphlets, or other written materials from your doctor or pharmacy?: 1 - Never  Interpreter Needed?: No  Information entered by :: AEversole, LPN  Past Medical History:  Diagnosis Date  . Condyloma acuminatum   . Dementia   .  Hyperlipidemia   . Hypertension   . Osteoporosis   . Post-menopausal   . Rosacea   . Thalamic infarction (HCC)   . Urinary retention    chronic  . Vitamin D deficiency disease    Past Surgical History:  Procedure Laterality Date  . SUPRAPUBIC CATHETER PLACEMENT  Nov. 2014   Family History  Problem Relation Age of Onset  . Arthritis Mother   . CAD Father   . Arthritis Father   . Arthritis Sister   . Arthritis Brother   . Arthritis Daughter   . Arthritis Son   . Ovarian cancer Neg Hx    Social History   Socioeconomic History  . Marital status: Married    Spouse name: Maxie  . Number of children: 4  . Years of education: some college  . Highest education level: 12th grade  Occupational History  . Occupation: Retired  Engineer, production  . Financial resource strain: Not hard at all  . Food insecurity:    Worry: Never true    Inability: Never true  . Transportation needs:    Medical: No    Non-medical: No  Tobacco Use  . Smoking status: Never Smoker  . Smokeless tobacco: Never Used  . Tobacco comment: smoking cessation materials not required  Substance and Sexual Activity  . Alcohol use: No  . Drug use: No  . Sexual activity: Not Currently  Lifestyle  . Physical activity:  Days per week: Patient refused    Minutes per session: Patient refused  . Stress: Patient refused  Relationships  . Social connections:    Talks on phone: Patient refused    Gets together: Patient refused    Attends religious service: Patient refused    Active member of club or organization: Patient refused    Attends meetings of clubs or organizations: Patient refused    Relationship status: Married  Other Topics Concern  . Not on file  Social History Narrative  . Not on file    Outpatient Encounter Medications as of 02/21/2018  Medication Sig  . acetaminophen (TYLENOL) 500 MG tablet Take 1 tablet (500 mg total) by mouth every 6 (six) hours as needed.  Marland Kitchen atorvastatin (LIPITOR) 20 MG  tablet TAKE 1 TABLET BY MOUTH AT BEDTIME DAILY.  . brimonidine (ALPHAGAN) 0.2 % ophthalmic solution Place 1 drop into both eyes 2 (two) times daily.  . dorzolamide-timolol (COSOPT) 22.3-6.8 MG/ML ophthalmic solution Place 1 drop into both eyes 2 (two) times daily.  Marland Kitchen latanoprost (XALATAN) 0.005 % ophthalmic solution Place 1 drop into both eyes daily.  . Melatonin 3 MG TABS One by mouth every night at 9 pm  . memantine (NAMENDA) 10 MG tablet TAKE 1 TABLET BY MOUTH TWICE DAILY FOR DEMENTIA  . VITAMIN B12 TR 1000 MCG TBCR TAKE 1 TABLET BY MOUTH EACH MORNING FOR SUPPLEMENT  . doxycycline (VIBRA-TABS) 100 MG tablet Take 1 tablet (100 mg total) by mouth 2 (two) times daily.   No facility-administered encounter medications on file as of 02/21/2018.     Activities of Daily Living In your present state of health, do you have any difficulty performing the following activities: 02/21/2018 05/16/2017  Hearing? N N  Comment denies hearing aids -  Vision? N N  Comment denies eyeglasses; glaucoma; B cataracts -  Difficulty concentrating or making decisions? Y Y  Comment dementia -  Walking or climbing stairs? Y Y  Comment requires assist, dementia -  Dressing or bathing? Malvin Johns  Comment requires assist, dementia -  Doing errands, shopping? Malvin Johns  Comment family and facility transports -  Quarry manager and eating ? Y -  Comment denies dentures; requires assist, dementia -  Using the Toilet? Y -  Comment requires assist, dementia -  In the past six months, have you accidently leaked urine? N -  Comment continent -  Do you have problems with loss of bowel control? N -  Managing your Medications? Y -  Comment SNF manages -  Managing your Finances? Y -  Comment family manages -  Housekeeping or managing your Housekeeping? Y -  Comment requires assist, dementia -  Some recent data might be hidden    Patient Care Team: Lada, Janit Bern, MD as PCP - General (Family Medicine) Lockie Mola, MD as  Consulting Physician (Ophthalmology)    Assessment:   This is a routine wellness examination for Felicia [name].  Exercise Activities and Dietary recommendations Current Exercise Habits: The patient does not participate in regular exercise at present, Exercise limited by: Other - see comments(dementia)  Goals    . DIET - INCREASE WATER INTAKE     Recommend to drink at least 6-8 8oz glasses of water per day.       Fall Risk Fall Risk  02/21/2018 05/16/2017 12/01/2016 02/26/2016 03/21/2015  Falls in the past year? No No No No Yes  Number falls in past yr: - - - - 2 or more  Injury  with Fall? - - - - No  Risk for fall due to : History of fall(s);Impaired balance/gait;Medication side effect;Mental status change;Impaired vision - - - -  Risk for fall due to: Comment dementia; shuffling gait; unsteady gait; glaucoma; B cataracts - - - -   FALL RISK PREVENTION PERTAINING TO HOME: Is your home free of loose throw rugs in walkways, pet beds, electrical cords, etc? Yes Is there adequate lighting in your home to reduce risk of falls?  Yes Are there stairs in or around your home WITH handrails? No, ramp  ASSISTIVE DEVICES UTILIZED TO PREVENT FALLS: Use of a cane, walker or w/c? No Grab bars in the bathroom? Yes  Shower chair or a place to sit while bathing? Yes An elevated toilet seat or a handicapped toilet? Yes  Timed Get Up and Go Performed: Yes. Pt ambulated 10 feet within 32 sec. Gait slow, slightly unsteady and without the use of an assistive device. No intervention required at this time. Fall risk prevention has been discussed.  Community Resource Referral:  [employer] Referral not required at this time.   Depression Screen PHQ 2/9 Scores 02/21/2018 05/16/2017 12/01/2016 02/26/2016  PHQ - 2 Score - 0 0 0  Exception Documentation Other- indicate reason in comment box - - -  Not completed Dementia - - -     Cognitive Function: declined and taking Namenda     6CIT Screen 05/16/2017    What Year? 4 points  What month? 3 points  What time? 3 points  Count back from 20 4 points  Months in reverse 4 points  Repeat phrase 10 points  Total Score 28    Immunization History  Administered Date(s) Administered  . Influenza, High Dose Seasonal PF 05/16/2017  . Influenza,inj,Quad PF,6+ Mos 09/21/2015  . PPD Test 09/21/2015  . Pneumococcal Conjugate-13 02/21/2014  . Pneumococcal Polysaccharide-23 09/20/2015  . Td 07/07/2001    Qualifies for Shingles Vaccine? Yes. Due for Shingrix. Education has been provided regarding the importance of this vaccine. Daughter has been advised to call insurance company to determine out of pocket expense. Advised may also receive vaccine at local pharmacy or Health Dept. Verbalized acceptance and understanding.  Due for Tdap vaccine. Education has been provided regarding the importance of this vaccine. Advised may receive this vaccine at local pharmacy or Health Dept. Aware to provide a copy of the vaccination record if obtained from local pharmacy or Health Dept. Daughter verbalized acceptance and understanding.   Screening Tests Health Maintenance  Topic Date Due  . DEXA SCAN  02/22/2019 (Originally 09/13/1994)  . TETANUS/TDAP  02/22/2019 (Originally 07/08/2011)  . INFLUENZA VACCINE  03/02/2018  . PNA vac Low Risk Adult  Completed    Cancer Screenings: Lung: Low Dose CT Chest recommended if Age 15-80 years, 30 pack-year currently smoking OR have quit w/in 15years. Patient does not qualify. Breast Screening: No longer required Up to date of Bone Density/Dexa? No. Daughter declined my offer to order this screening today. Daughter would prefer to discuss with Dr. Sherie Don. Colorectal: No longer required  Additional Screenings: Hepatitis C Screening: Does not qualify    Plan:  I have personally reviewed and addressed the Medicare Annual Wellness questionnaire and have noted the following in the patient's chart:  A. Medical and social  history B. Use of alcohol, tobacco or illicit drugs  C. Current medications and supplements D. Functional ability and status E.  Nutritional status F.  Physical activity G. Advance directives H. List of other  physicians I.  Hospitalizations, surgeries, and ER visits in previous 12 months J.  Vitals K. Screenings such as hearing and vision if needed, cognitive and depression L. Referrals and appointments  In addition, I have reviewed and discussed with patient certain preventive protocols, quality metrics, and best practice recommendations. A written personalized care plan for preventive services as well as general preventive health recommendations were provided to patient.  See attached scanned questionnaire for additional information.   Signed,  Deon PillingAmmie Sophonie Goforth, LPN Nurse Health Advisor --------------------------------------- I have reviewed this encounter including the documentation in this note and/or discussed this patient with the provider, Deon PillingAmmie Glorimar Stroope, LPN. I am certifying that I agree with the content of this note as supervising physician. Baruch GoutyMelinda Lada, MD [facility]

## 2018-02-21 NOTE — Patient Instructions (Addendum)
Felicia [name] , Thank you for taking time to come for your Medicare Wellness Visit. I appreciate your ongoing commitment to your health goals. Please review the following plan we discussed and let me know if I can assist you in the future.   Screening recommendations/referrals: Colorectal Screening: No longer required Mammogram: No longer required Bone Density: Please discuss with Dr. Sherie DonLada  Vision and Dental Exams: Recommended annual ophthalmology exams for early detection of glaucoma and other disorders of the eye Recommended annual dental exams for proper oral hygiene  Vaccinations: Influenza vaccine: Up to date Pneumococcal vaccine: Up to date Tdap vaccine: Declined. Please call your insurance company to determine your out of pocket expense. You may also receive this vaccine at your local pharmacy or Health Dept. Shingles vaccine: Please call your insurance company to determine your out of pocket expense for the Shingrix vaccine. You may also receive this vaccine at your local pharmacy or Health Dept.  Advanced directives: We have received a copy of your POA (Power of Tees TohAttorney) and/or Living Will. These documents can be located in your chart.  Goals: Recommend to drink at least 6-8 8oz glasses of water per day.  Next appointment: Please schedule your Annual Wellness Visit with your Nurse Health Advisor in one year.  Preventive Care 4065 Years and Older, Female Preventive care refers to lifestyle choices and visits with your health care provider that can promote health and wellness. What does preventive care include?  A yearly physical exam. This is also called an annual well check.  Dental exams once or twice a year.  Routine eye exams. Ask your health care provider how often you should have your eyes checked.  Personal lifestyle choices, including:  Daily care of your teeth and gums.  Regular physical activity.  Eating a healthy diet.  Avoiding tobacco and drug use.  Limiting  alcohol use.  Practicing safe sex.  Taking low-dose aspirin every day.  Taking vitamin and mineral supplements as recommended by your health care provider. What happens during an annual well check? The services and screenings done by your health care provider during your annual well check will depend on your age, overall health, lifestyle risk factors, and family history of disease. Counseling  Your health care provider may ask you questions about your:  Alcohol use.  Tobacco use.  Drug use.  Emotional well-being.  Home and relationship well-being.  Sexual activity.  Eating habits.  History of falls.  Memory and ability to understand (cognition).  Work and work Astronomerenvironment.  Reproductive health. Screening  You may have the following tests or measurements:  Height, weight, and BMI.  Blood pressure.  Lipid and cholesterol levels. These may be checked every 5 years, or more frequently if you are over 21100 years old.  Skin check.  Lung cancer screening. You may have this screening every year starting at age 155 if you have a 30-pack-year history of smoking and currently smoke or have quit within the past 15 years.  Fecal occult blood test (FOBT) of the stool. You may have this test every year starting at age 82.  Flexible sigmoidoscopy or colonoscopy. You may have a sigmoidoscopy every 5 years or a colonoscopy every 10 years starting at age 82.  Hepatitis C blood test.  Hepatitis B blood test.  Sexually transmitted disease (STD) testing.  Diabetes screening. This is done by checking your blood sugar (glucose) after you have not eaten for a while (fasting). You may have this done every 1-3 years.  Bone density scan. This is done to screen for osteoporosis. You may have this done starting at age 67.  Mammogram. This may be done every 1-2 years. Talk to your health care provider about how often you should have regular mammograms. Talk with your health care provider  about your test results, treatment options, and if necessary, the need for more tests. Vaccines  Your health care provider may recommend certain vaccines, such as:  Influenza vaccine. This is recommended every year.  Tetanus, diphtheria, and acellular pertussis (Tdap, Td) vaccine. You may need a Td booster every 10 years.  Zoster vaccine. You may need this after age 82.  Pneumococcal 13-valent conjugate (PCV13) vaccine. One dose is recommended after age 34.  Pneumococcal polysaccharide (PPSV23) vaccine. One dose is recommended after age 74. Talk to your health care provider about which screenings and vaccines you need and how often you need them. This information is not intended to replace advice given to you by your health care provider. Make sure you discuss any questions you have with your health care provider. Document Released: 08/15/2015 Document Revised: 04/07/2016 Document Reviewed: 05/20/2015 Elsevier Interactive Patient Education  2017 [place] Prevention in the Home Falls can cause injuries. They can happen to people of all ages. There are many things you can do to make your home safe and to help prevent falls. What can I do on the outside of my home?  Regularly fix the edges of walkways and driveways and fix any cracks.  Remove anything that might make you trip as you walk through a door, such as a raised step or threshold.  Trim any bushes or trees on the path to your home.  Use bright outdoor lighting.  Clear any walking paths of anything that might make someone trip, such as rocks or tools.  Regularly check to see if handrails are loose or broken. Make sure that both sides of any steps have handrails.  Any raised decks and porches should have guardrails on the edges.  Have any leaves, snow, or ice cleared regularly.  Use sand or salt on walking paths during winter.  Clean up any spills in your garage right away. This includes oil or grease  spills. What can I do in the bathroom?  Use night lights.  Install grab bars by the toilet and in the tub and shower. Do not use towel bars as grab bars.  Use non-skid mats or decals in the tub or shower.  If you need to sit down in the shower, use a plastic, non-slip stool.  Keep the floor dry. Clean up any water that spills on the floor as soon as it happens.  Remove soap buildup in the tub or shower regularly.  Attach bath mats securely with double-sided non-slip rug tape.  Do not have throw rugs and other things on the floor that can make you trip. What can I do in the bedroom?  Use night lights.  Make sure that you have a light by your bed that is easy to reach.  Do not use any sheets or blankets that are too big for your bed. They should not hang down onto the floor.  Have a firm chair that has side arms. You can use this for support while you get dressed.  Do not have throw rugs and other things on the floor that can make you trip. What can I do in the kitchen?  Clean up any spills right away.  Avoid walking  on wet floors.  Keep items that you use a lot in easy-to-reach places.  If you need to reach something above you, use a strong step stool that has a grab bar.  Keep electrical cords out of the way.  Do not use floor polish or wax that makes floors slippery. If you must use wax, use non-skid floor wax.  Do not have throw rugs and other things on the floor that can make you trip. What can I do with my stairs?  Do not leave any items on the stairs.  Make sure that there are handrails on both sides of the stairs and use them. Fix handrails that are broken or loose. Make sure that handrails are as long as the stairways.  Check any carpeting to make sure that it is firmly attached to the stairs. Fix any carpet that is loose or worn.  Avoid having throw rugs at the top or bottom of the stairs. If you do have throw rugs, attach them to the floor with carpet  tape.  Make sure that you have a light switch at the top of the stairs and the bottom of the stairs. If you do not have them, ask someone to add them for you. What else can I do to help prevent falls?  Wear shoes that:  Do not have high heels.  Have rubber bottoms.  Are comfortable and fit you well.  Are closed at the toe. Do not wear sandals.  If you use a stepladder:  Make sure that it is fully opened. Do not climb a closed stepladder.  Make sure that both sides of the stepladder are locked into place.  Ask someone to hold it for you, if possible.  Clearly mark and make sure that you can see:  Any grab bars or handrails.  First and last steps.  Where the edge of each step is.  Use tools that help you move around (mobility aids) if they are needed. These include:  Canes.  Walkers.  Scooters.  Crutches.  Turn on the lights when you go into a dark area. Replace any light bulbs as soon as they burn out.  Set up your furniture so you have a clear path. Avoid moving your furniture around.  If any of your floors are uneven, fix them.  If there are any pets around you, be aware of where they are.  Review your medicines with your doctor. Some medicines can make you feel dizzy. This can increase your chance of falling. Ask your doctor what other things that you can do to help prevent falls. This information is not intended to replace advice given to you by your health care provider. Make sure you discuss any questions you have with your health care provider. Document Released: 05/15/2009 Document Revised: 12/25/2015 Document Reviewed: 08/23/2014 Elsevier Interactive Patient Education  2017 [employer].

## 2018-02-21 NOTE — Assessment & Plan Note (Signed)
Continue namenda 

## 2018-02-23 LAB — COMPLETE METABOLIC PANEL WITH GFR
AG RATIO: 1.9 (calc) (ref 1.0–2.5)
ALBUMIN MSPROF: 3.9 g/dL (ref 3.6–5.1)
ALKALINE PHOSPHATASE (APISO): 56 U/L (ref 33–130)
ALT: 7 U/L (ref 6–29)
AST: 15 U/L (ref 10–35)
BILIRUBIN TOTAL: 0.5 mg/dL (ref 0.2–1.2)
BUN: 13 mg/dL (ref 7–25)
CHLORIDE: 101 mmol/L (ref 98–110)
CO2: 29 mmol/L (ref 20–32)
CREATININE: 0.7 mg/dL (ref 0.60–0.88)
Calcium: 9.1 mg/dL (ref 8.6–10.4)
GFR, Est African American: 90 mL/min/{1.73_m2} (ref >=60)
GFR, Est Non African American: 77 mL/min/{1.73_m2} (ref >=60)
GLOBULIN: 2.1 g/dL (ref 1.9–3.7)
Glucose, Bld: 96 mg/dL (ref 65–139)
Potassium: 3.6 mmol/L (ref 3.5–5.3)
Sodium: 140 mmol/L (ref 135–146)
Total Protein: 6 g/dL — ABNORMAL LOW (ref 6.1–8.1)

## 2018-02-23 LAB — VITAMIN D 25 HYDROXY (VIT D DEFICIENCY, FRACTURES): Vit D, 25-Hydroxy: 22 ng/mL — ABNORMAL LOW (ref 30–100)

## 2018-02-23 LAB — CBC WITH DIFFERENTIAL/PLATELET
BASOS ABS: 23 {cells}/uL (ref 0–200)
Basophils Relative: 0.4 %
EOS ABS: 68 {cells}/uL (ref 15–500)
Eosinophils Relative: 1.2 %
HEMATOCRIT: 40.5 % (ref 35.0–45.0)
HEMOGLOBIN: 13.7 g/dL (ref 11.7–15.5)
LYMPHS ABS: 1368 {cells}/uL (ref 850–3900)
MCH: 30.4 pg (ref 27.0–33.0)
MCHC: 33.8 g/dL (ref 32.0–36.0)
MCV: 90 fL (ref 80.0–100.0)
MPV: 13 fL — ABNORMAL HIGH (ref 7.5–12.5)
Monocytes Relative: 8.5 %
NEUTROS ABS: 3756 {cells}/uL (ref 1500–7800)
NEUTROS PCT: 65.9 %
Platelets: 239 10*3/uL (ref 140–400)
RBC: 4.5 10*6/uL (ref 3.80–5.10)
RDW: 11.8 % (ref 11.0–15.0)
Total Lymphocyte: 24 %
WBC: 5.7 10*3/uL (ref 3.8–10.8)
WBCMIX: 485 {cells}/uL (ref 200–950)

## 2018-02-23 LAB — LIPID PANEL
Cholesterol: 127 mg/dL (ref <200)
HDL: 55 mg/dL (ref >50)
LDL CHOLESTEROL (CALC): 55 mg/dL
Non-HDL Cholesterol (Calc): 72 mg/dL (calc) (ref <130)
TRIGLYCERIDES: 90 mg/dL (ref <150)
Total CHOL/HDL Ratio: 2.3 (calc) (ref <5.0)

## 2018-02-23 LAB — QUANTIFERON-TB GOLD PLUS
NIL: 0.02 [IU]/mL
QUANTIFERON-TB GOLD PLUS: NEGATIVE
TB1-NIL: 0.02 [IU]/mL
TB2-NIL: 0.02 IU/mL

## 2018-02-24 ENCOUNTER — Telehealth: Payer: Self-pay | Admitting: Family Medicine

## 2018-02-24 NOTE — Telephone Encounter (Signed)
Copied from CRM [id number]. Topic: Quick Communication - See Telephone Encounter >> Feb 24, 2018  2:36 PM Raquel SarnaHayes, Teresa G wrote: E[facility] [phone]  Faxing over updated Fl2 Asking Dr. Sherie DonLada to review, sign and fax back asap.  Pt is moving in on the July 31st.

## 2018-02-24 NOTE — Telephone Encounter (Signed)
My original form has been altered I need to know who did that and why

## 2018-04-12 ENCOUNTER — Other Ambulatory Visit: Payer: Self-pay | Admitting: Family Medicine

## 2018-04-13 ENCOUNTER — Emergency Department (HOSPITAL_COMMUNITY): Payer: Medicare Other

## 2018-04-13 ENCOUNTER — Emergency Department (HOSPITAL_COMMUNITY)
Admission: EM | Admit: 2018-04-13 | Discharge: 2018-04-13 | Disposition: A | Discharge status: Home / Self Care | Payer: Medicare Other | Attending: Emergency Medicine | Admitting: Emergency Medicine

## 2018-04-13 DIAGNOSIS — F0390 Unspecified dementia without behavioral disturbance: Secondary | ICD-10-CM | POA: Insufficient documentation

## 2018-04-13 DIAGNOSIS — Y999 Unspecified external cause status: Secondary | ICD-10-CM | POA: Insufficient documentation

## 2018-04-13 DIAGNOSIS — Y92129 Unspecified place in nursing home as the place of occurrence of the external cause: Secondary | ICD-10-CM | POA: Insufficient documentation

## 2018-04-13 DIAGNOSIS — S32592A Other specified fracture of left pubis, initial encounter for closed fracture: Secondary | ICD-10-CM

## 2018-04-13 DIAGNOSIS — I10 Essential (primary) hypertension: Secondary | ICD-10-CM | POA: Insufficient documentation

## 2018-04-13 DIAGNOSIS — Z79899 Other long term (current) drug therapy: Secondary | ICD-10-CM | POA: Diagnosis not present

## 2018-04-13 DIAGNOSIS — W19XXXA Unspecified fall, initial encounter: Secondary | ICD-10-CM

## 2018-04-13 DIAGNOSIS — Y9389 Activity, other specified: Secondary | ICD-10-CM | POA: Diagnosis not present

## 2018-04-13 DIAGNOSIS — W0110XA Fall on same level from slipping, tripping and stumbling with subsequent striking against unspecified object, initial encounter: Secondary | ICD-10-CM | POA: Insufficient documentation

## 2018-04-13 DIAGNOSIS — S3993XA Unspecified injury of pelvis, initial encounter: Secondary | ICD-10-CM | POA: Diagnosis present

## 2018-04-13 LAB — URINALYSIS, ROUTINE W REFLEX MICROSCOPIC
BILIRUBIN URINE: NEGATIVE
GLUCOSE, UA: NEGATIVE mg/dL
HGB URINE DIPSTICK: NEGATIVE
KETONES UR: NEGATIVE mg/dL
LEUKOCYTES UA: NEGATIVE
Nitrite: NEGATIVE
PH: 8 (ref 5.0–8.0)
Protein, ur: NEGATIVE mg/dL
Specific Gravity, Urine: 1.01 (ref 1.005–1.030)

## 2018-04-13 MED ORDER — ACETAMINOPHEN 325 MG PO TABS
650.0000 mg | ORAL_TABLET | Freq: Once | ORAL | Status: AC
  Administered 2018-04-13: 325 mg via ORAL
  Filled 2018-04-13: qty 2

## 2018-04-13 NOTE — Care Management Note (Signed)
Case Management Note  Patient Details  Name: Felicia [name] MRN: [id number] Date of Birth: Nov 09, 1927  [facility]  [facility]    CM spoke with Jill SideAlison, [facility].  Pt is already active with [facility].  CM discussed DME needs.  They are requesting a hospital bed and a wheelchair.  Facility is also requesting pain control medication of some kind.  CM updated Dr. Particia NearingHaviland.  CM contacted [facility]Karen [facility] and to contact the supervisor at the facility when scheduling delivery.    CM contacted Marcelino DusterMichelle with [facility] to advise of new HH orders and discuss pt's needs.  No further CM needs noted at this time.  Expected Discharge Date:   04/13/2018               Expected Discharge Plan:  Home w Home Health Services  In-House Referral:  Clinical Social Work  Discharge planning Services  CM Consult  Post Acute Care Choice:  Durable Medical Equipment, Home Health Choice offered to:  NA  DME Arranged:  Wheelchair manual, Hospital bed DME Agency:  [facility]  HH Arranged:  PT, OT, Social Work [facility] Agency:  [facility]  Status of Service:  Completed, signed off  Rica KoyanagiKritzer, Chieko Neises N, RN 04/13/2018, 3:03 PM

## 2018-04-13 NOTE — ED Notes (Signed)
[facility] called for transport.  

## 2018-04-13 NOTE — Progress Notes (Signed)
CSW received consult for placement concerns. CSW aware [facility] and is staying in their memory care unit. CSW spoke with EDP who stated patient has experienced pelvic fractures. CSW discussed patient [facility]. CSW consulted with RNCM to see what patient qualifies for. CSW informed patient is being set up with DMEs and pain relief and is already set up with home health services. No further CSW needs at this time. Please reconsult if needs arise.  Archie BalboaMackenzie Irwin, LCSWA  Clinical Social Work Department  [facility]  [phone]

## 2018-04-13 NOTE — ED Provider Notes (Signed)
[facility]-EMERGENCY DEPT Provider Note   CSN: [id number] Arrival date & time: 04/13/18  0715     History   Chief Complaint Chief Complaint  Patient presents with  . Fall    HPI Felicia [name] is a 84 y.o. female.  Pt presents to the ED today s/p fall.  Pt is a poor historian due to dementia and information is obtained from EMS.  Pt lives at [facility] and fell while getting dressed.  Pt told me she had back pain.  She told EMS she had hip pain.  She does have a cough.  She had a CXR at the facility yesterday which was negative.     Past Medical History:  Diagnosis Date  . Condyloma acuminatum   . Dementia   . Hyperlipidemia   . Hypertension   . Osteoporosis   . Post-menopausal   . Rosacea   . Thalamic infarction (HCC)   . Urinary retention    chronic  . Vitamin D deficiency disease     Patient Active Problem List   Diagnosis Date Noted  . Elevated red blood cell count 06/13/2017  . Weight loss 05/16/2017  . Gait instability 05/16/2017  . At moderate risk for fall 02/26/2016  . DNAR (do not attempt resuscitation) 10/15/2015  . Weakness 10/15/2015  . Generalized weakness 09/21/2015  . Medication monitoring encounter 03/21/2015  . Breast cancer screening 03/21/2015  . Rosacea   . Hyperlipidemia   . Hypertension   . Osteoporosis   . Dementia   . Vitamin D deficiency disease   . Condyloma acuminatum   . Thalamic infarction [facility])     Past Surgical History:  Procedure Laterality Date  . SUPRAPUBIC CATHETER PLACEMENT  Nov. 2014     OB History   None      Home Medications    Prior to Admission medications   Medication Sig Start Date End Date Taking? Authorizing Provider  acetaminophen (TYLENOL) 500 MG tablet Take 500 mg by mouth every 6 (six) hours as needed for mild pain.  06/30/17  Yes Lada, Janit Bern, MD  atorvastatin (LIPITOR) 20 MG tablet TAKE 1 TABLET BY MOUTH AT BEDTIME DAILY. Patient taking differently: Take 20 mg by  mouth at bedtime.  01/10/18  Yes Lada, Janit Bern, MD  brimonidine (ALPHAGAN) 0.2 % ophthalmic solution Place 1 drop into both eyes 2 (two) times daily. 03/12/17  Yes [provider]  dorzolamide-timolol (COSOPT) 22.3-6.8 MG/ML ophthalmic solution Place 1 drop into both eyes 2 (two) times daily.   Yes [provider]  latanoprost (XALATAN) 0.005 % ophthalmic solution Place 1 drop into both eyes daily. 03/12/17  Yes [provider]  levofloxacin (LEVAQUIN) 750 MG tablet Take 750 mg by mouth daily.   Yes [provider]  Melatonin 3 MG TABS One by mouth every night at 9 pm Patient taking differently: Take 3 mg by mouth at bedtime.  02/06/18  Yes Lada, Janit Bern, MD  memantine (NAMENDA) 10 MG tablet TAKE 1 TABLET BY MOUTH TWICE DAILY FOR DEMENTIA Patient taking differently: Take 10 mg by mouth 2 (two) times daily.  04/12/18  Yes Lada, Janit Bern, MD  Netarsudil Dimesylate (RHOPRESSA) 0.02 % SOLN Place 1 drop into both eyes at bedtime.   Yes [provider]  VITAMIN B12 TR 1000 MCG TBCR TAKE 1 TABLET BY MOUTH EACH MORNING FOR SUPPLEMENT Patient taking differently: Take 1,000 mcg by mouth daily.  11/10/17  Yes Lada, Janit Bern, MD  Family History Family History  Problem Relation Age of Onset  . Arthritis Mother   . CAD Father   . Arthritis Father   . Arthritis Sister   . Arthritis Brother   . Arthritis Daughter   . Arthritis Son   . Ovarian cancer Neg Hx     Social History Social History   Tobacco Use  . Smoking status: Never Smoker  . Smokeless tobacco: Never Used  . Tobacco comment: smoking cessation materials not required  Substance Use Topics  . Alcohol use: No  . Drug use: No     Allergies   Patient has no known allergies.   Review of Systems Review of Systems  Respiratory: Positive for cough.   Musculoskeletal: Positive for back pain.       Hip pain  All other systems reviewed and are negative.    Physical Exam Updated Vital  Signs BP (!) 144/78 (BP Location: Left Arm)   Pulse 83   Temp 99.9 F (37.7 C) (Oral)   Resp 18   SpO2 94%   Physical Exam  Constitutional: She appears well-developed and well-nourished.  HENT:  Head: Normocephalic and atraumatic.  Right Ear: External ear normal.  Left Ear: External ear normal.  Nose: Nose normal.  Mouth/Throat: Oropharynx is clear and moist.  Eyes: Pupils are equal, round, and reactive to light. Conjunctivae and EOM are normal.  Neck: Normal range of motion. Neck supple.  Cardiovascular: Normal rate, regular rhythm, normal heart sounds and intact distal pulses.  Pulmonary/Chest: Effort normal and breath sounds normal.  Abdominal: Soft. Bowel sounds are normal.  Musculoskeletal:       Left hip: She exhibits tenderness.       Lumbar back: She exhibits tenderness.  Neurological: She is alert.  Oriented times 2 (normal)  Skin: Skin is warm. Capillary refill takes less than 2 seconds.  Psychiatric: She has a normal mood and affect. Her behavior is normal. Judgment and thought content normal.  Nursing note and vitals reviewed.    ED Treatments / Results  Labs (all labs ordered are listed, but only abnormal results are displayed) Labs Reviewed  URINALYSIS, ROUTINE W REFLEX MICROSCOPIC - Abnormal; Notable for the following components:      Result Value   Color, Urine STRAW (*)    All other components within normal limits    EKG None  Radiology Dg Chest 2 View  Result Date: 04/13/2018 CLINICAL DATA:  Larey Seat at nursing home today.  Left hip pain.  Cough. EXAM: CHEST - 2 VIEW COMPARISON:  09/09/2015 FINDINGS: Heart size is normal. There is aortic atherosclerosis. The lungs are clear. The vascularity is normal. No effusions. No evidence of spinal or chest fracture. IMPRESSION: No active disease.  Aortic atherosclerosis. Electronically Signed   By: Paulina Fusi M.D.   On: 04/13/2018 09:19   Dg Lumbar Spine Complete  Result Date: 04/13/2018 CLINICAL DATA:  Larey Seat  at nursing home today.  Back and hip pain. EXAM: LUMBAR SPINE - COMPLETE 4+ VIEW COMPARISON:  CT 09/18/2015 FINDINGS: Five lumbar type vertebral bodies. No traumatic malalignment. Degenerative anterolisthesis at L4-5 of 5 mm due to facet arthropathy. Lower lumbar degenerative disc disease and degenerative facet disease. No evidence of lumbar vertebral fracture or sacrococcygeal fracture. IMPRESSION: Chronic degenerative changes as above. No acute or traumatic finding in the lumbar region. Electronically Signed   By: Paulina Fusi M.D.   On: 04/13/2018 09:20   Ct Hip Left Wo Contrast  Result Date: 04/13/2018 CLINICAL DATA:  Left hip pain status post injury. EXAM: CT OF THE LEFT HIP WITHOUT CONTRAST TECHNIQUE: Multidetector CT imaging of the left hip was performed according to the standard protocol. Multiplanar CT image reconstructions were also generated. COMPARISON:  None. FINDINGS: Bones/Joint/Cartilage Generalized osteopenia. No left hip fracture or dislocation. Joint space narrowing of left hip joint space with marginal osteophytes and subchondral cystic changes most consistent with mild-moderate osteoarthritis. There is relative cortical irregularity on the lateral margin of proximal left femoral diaphysis likely reflecting artifact secondary to patient motion. Acute nondisplaced left superior pubic ramus fracture. Acute nondisplaced left inferior pubic ramus fracture. Ligaments Suboptimally assessed by CT. Muscles and Tendons Muscles are normal. No muscle atrophy. No intramuscular fluid collection or hematoma. Soft tissues No fluid collection or hematoma. Peripheral vascular atherosclerotic disease. IMPRESSION: 1. Acute nondisplaced fractures of the left superior and inferior pubic rami. 2.  No acute osseous injury of the left proximal femur. 3. Mild-moderate osteoarthritis of the left hip. Electronically Signed   By: Elige Ko   On: 04/13/2018 10:20   Dg Hip Unilat W Or Wo Pelvis 2-3 Views  Left  Result Date: 04/13/2018 CLINICAL DATA:  Larey Seat in the nursing home.  Left hip pain. EXAM: DG HIP (WITH OR WITHOUT PELVIS) 2-3V LEFT COMPARISON:  None. FINDINGS: Artifact overlies the region. Allowing for that, there is no evidence of fracture of the femur. I suspect that there are nondisplaced superior and inferior rami fractures on the left. IMPRESSION: Probable nondisplaced superior and inferior rami fractures on the left. Electronically Signed   By: Paulina Fusi M.D.   On: 04/13/2018 09:22    Procedures Procedures (including critical care time)  Medications Ordered in ED Medications  acetaminophen (TYLENOL) tablet 650 mg (325 mg Oral Given 04/13/18 1254)     Initial Impression / Assessment and Plan / ED Course  I have reviewed the triage vital signs and the nursing notes.  Pertinent labs & imaging results that were available during my care of the patient were reviewed by me and considered in my medical decision making (see chart for details).    Pt is able to ambulate with assistance, but with pain.  Pt's facility is assisted living.  Due to insurance issues, she can't go directly to SNF.  No indication for hospitalization.  I will order home health to come to facility and will get social work and case management involved.  I ordered a walker, wheelchair, and hospital bed for pt's use.      Durable Medical Equipment  (From admission, onward)         Start     Ordered   04/13/18 1450  For home use only DME standard manual wheelchair with seat cushion  Once    Comments:  Patient suffers from pelvic fractures which impairs their ability to perform daily activities like walking in the home.  A walker will not resolve  issue with performing activities of daily living. A wheelchair will allow patient to safely perform daily activities. Patient can safely propel the wheelchair in the home or has a caregiver who can provide assistance.  Accessories: elevating leg rests (ELRs), wheel  locks, extensions and anti-tippers.   04/13/18 1450   04/13/18 1449  For home use only DME Hospital bed  Once    Question Answer Comment  Patient has (list medical condition): pelvic fractures   The above medical condition requires: Patient requires the ability to reposition frequently   Bed type Semi-electric  04/13/18 1450   04/13/18 1446  For home use only DME Walker rolling  Once    Question:  Patient needs a walker to treat with the following condition  Answer:  Pelvic fracture (HCC)   04/13/18 1445          Final Clinical Impressions(s) / ED Diagnoses   Final diagnoses:  Fall, initial encounter  Fracture of multiple pubic rami, left, closed, initial encounter [facility](HCC)    ED Discharge Orders    None       Jacalyn LefevreHaviland, Roe Wilner, MD 04/13/18 1451

## 2018-04-13 NOTE — ED Notes (Signed)
Bed: [id number] Expected date:  Expected time:  Means of arrival:  Comments: 84 yr old fall, hip pain

## 2018-04-13 NOTE — ED Notes (Signed)
Urine culture sent to lab with UA sample 

## 2018-04-13 NOTE — ED Notes (Signed)
Bed: WHALD Expected date:  Expected time:  Means of arrival:  Comments: 

## 2018-04-13 NOTE — ED Notes (Signed)
Patient ambulatory with two staff assistance in room. Patient appeared to be in pain during time of ambulation. Patient grimacing while ambulating.

## 2018-04-13 NOTE — ED Triage Notes (Addendum)
Per EMS, pt is coming from [facility] after experiencing a fall while getting dressed. Pt complains of left hip pain. EMS reports no shortening and rotation. EMS also states that pt has a productive cough and facility staff reported a negative chest x ray. Pt has a hx of dementia but EMS reports that pt is AO x4.

## 2018-05-03 ENCOUNTER — Other Ambulatory Visit: Payer: Self-pay | Admitting: Family Medicine

## 2018-05-11 ENCOUNTER — Other Ambulatory Visit: Payer: Self-pay

## 2018-05-11 ENCOUNTER — Encounter (HOSPITAL_COMMUNITY): Payer: Self-pay | Admitting: Emergency Medicine

## 2018-05-11 ENCOUNTER — Emergency Department (HOSPITAL_COMMUNITY): Payer: Medicare Other

## 2018-05-11 ENCOUNTER — Emergency Department (HOSPITAL_COMMUNITY)
Admission: EM | Admit: 2018-05-11 | Discharge: 2018-05-11 | Disposition: A | Discharge status: Home / Self Care | Payer: Medicare Other | Attending: Emergency Medicine | Admitting: Emergency Medicine

## 2018-05-11 DIAGNOSIS — Y939 Activity, unspecified: Secondary | ICD-10-CM | POA: Insufficient documentation

## 2018-05-11 DIAGNOSIS — Y999 Unspecified external cause status: Secondary | ICD-10-CM | POA: Diagnosis not present

## 2018-05-11 DIAGNOSIS — F0390 Unspecified dementia without behavioral disturbance: Secondary | ICD-10-CM | POA: Diagnosis not present

## 2018-05-11 DIAGNOSIS — W0110XA Fall on same level from slipping, tripping and stumbling with subsequent striking against unspecified object, initial encounter: Secondary | ICD-10-CM | POA: Insufficient documentation

## 2018-05-11 DIAGNOSIS — Y929 Unspecified place or not applicable: Secondary | ICD-10-CM | POA: Insufficient documentation

## 2018-05-11 DIAGNOSIS — I10 Essential (primary) hypertension: Secondary | ICD-10-CM | POA: Insufficient documentation

## 2018-05-11 DIAGNOSIS — S0990XA Unspecified injury of head, initial encounter: Secondary | ICD-10-CM | POA: Insufficient documentation

## 2018-05-11 DIAGNOSIS — W19XXXA Unspecified fall, initial encounter: Secondary | ICD-10-CM

## 2018-05-11 DIAGNOSIS — Z79899 Other long term (current) drug therapy: Secondary | ICD-10-CM | POA: Diagnosis not present

## 2018-05-11 DIAGNOSIS — S0083XA Contusion of other part of head, initial encounter: Secondary | ICD-10-CM | POA: Diagnosis present

## 2018-05-11 NOTE — ED Provider Notes (Signed)
[facility]-EMERGENCY DEPT Provider Note   CSN: [id number] Arrival date & time: 05/11/18  4098     History   Chief Complaint Chief Complaint  Patient presents with  . Fall    HPI Felicia [name] is a 84 y.o. female. Level 5 caveat due to dementia. HPI Patient presents after a fall.  History of dementia.  Patient found on the floor with hematoma to forehead.  Not ambulatory at baseline.  Patient has baseline dementia and is reportedly at her baseline.  Without complaints.  Hematoma on forehead. Past Medical History:  Diagnosis Date  . Condyloma acuminatum   . Dementia (HCC)   . Hyperlipidemia   . Hypertension   . Osteoporosis   . Post-menopausal   . Rosacea   . Thalamic infarction (HCC)   . Urinary retention    chronic  . Vitamin D deficiency disease     Patient Active Problem List   Diagnosis Date Noted  . Elevated red blood cell count 06/13/2017  . Weight loss 05/16/2017  . Gait instability 05/16/2017  . At moderate risk for fall 02/26/2016  . DNAR (do not attempt resuscitation) 10/15/2015  . Weakness 10/15/2015  . Generalized weakness 09/21/2015  . Medication monitoring encounter 03/21/2015  . Breast cancer screening 03/21/2015  . Rosacea   . Hyperlipidemia   . Hypertension   . Osteoporosis   . Dementia (HCC)   . Vitamin D deficiency disease   . Condyloma acuminatum   . Thalamic infarction [facility])     Past Surgical History:  Procedure Laterality Date  . SUPRAPUBIC CATHETER PLACEMENT  Nov. 2014     OB History   None      Home Medications    Prior to Admission medications   Medication Sig Start Date End Date Taking? Authorizing Provider  acetaminophen (TYLENOL) 500 MG tablet Take 500 mg by mouth every 6 (six) hours as needed for mild pain.  06/30/17  Yes Lada, Janit Bern, MD  atorvastatin (LIPITOR) 20 MG tablet TAKE 1 TABLET BY MOUTH AT BEDTIME DAILY. Patient taking differently: Take 20 mg by mouth at bedtime.  01/10/18  Yes  Lada, Janit Bern, MD  brimonidine (ALPHAGAN) 0.2 % ophthalmic solution Place 1 drop into both eyes 2 (two) times daily. 03/12/17  Yes [provider]  dorzolamide-timolol (COSOPT) 22.3-6.8 MG/ML ophthalmic solution Place 1 drop into both eyes 2 (two) times daily.   Yes [provider]  latanoprost (XALATAN) 0.005 % ophthalmic solution Place 1 drop into both eyes daily. 03/12/17  Yes [provider]  Melatonin 3 MG TABS One by mouth every night at 9 pm Patient taking differently: Take 3 mg by mouth at bedtime.  02/06/18  Yes Lada, Janit Bern, MD  memantine (NAMENDA) 10 MG tablet TAKE 1 TABLET BY MOUTH TWICE DAILY FOR DEMENTIA Patient taking differently: Take 10 mg by mouth 2 (two) times daily.  04/12/18  Yes Lada, Janit Bern, MD  Sodium Fluoride (ACT RESTORING FLUORIDE RINSE MT) Use as directed 30 mLs in the mouth or throat at bedtime.   Yes [provider]  traMADol (ULTRAM) 50 MG tablet Take 50 mg by mouth 3 (three) times daily as needed for moderate pain or severe pain.   Yes [provider]  VITAMIN B12 TR 1000 MCG TBCR TAKE 1 TABLET BY MOUTH EACH MORNING FOR SUPPLEMENT Patient taking differently: Take 1,000 mcg by mouth daily.  11/10/17  Yes Lada, Janit Bern, MD  Vitamin D, Ergocalciferol, (DRISDOL) 50000 units  CAPS capsule Take 50,000 Units by mouth every 7 (seven) days.   Yes [provider]    Family History Family History  Problem Relation Age of Onset  . Arthritis Mother   . CAD Father   . Arthritis Father   . Arthritis Sister   . Arthritis Brother   . Arthritis Daughter   . Arthritis Son   . Ovarian cancer Neg Hx     Social History Social History   Tobacco Use  . Smoking status: Never Smoker  . Smokeless tobacco: Never Used  . Tobacco comment: smoking cessation materials not required  Substance Use Topics  . Alcohol use: No  . Drug use: No     Allergies   Patient has no known allergies.   Review of Systems Review of  Systems  Unable to perform ROS: Dementia     Physical Exam Updated Vital Signs BP 134/75   Pulse 72   Temp 98.3 F (36.8 C) (Oral)   Resp 15   SpO2 100%   Physical Exam  Constitutional: She appears well-developed.  HENT:  Hematoma on left forehead  Eyes: Pupils are equal, round, and reactive to light.  Neck: Neck supple.  Cardiovascular: Normal rate.  Pulmonary/Chest: Effort normal. She exhibits no tenderness.  Abdominal: There is no tenderness.  Musculoskeletal: She exhibits no tenderness.  No tenderness over upper lower extremities.  Sitting in bed.  No cervical spine tenderness and painless range of motion.  Neurological: She is alert.  Patient is alert but demented.  Moves all extremities.  Skin: Skin is warm.     ED Treatments / Results  Labs (all labs ordered are listed, but only abnormal results are displayed) Labs Reviewed - No data to display  EKG None  Radiology Ct Head Wo Contrast  Result Date: 05/11/2018 CLINICAL DATA:  Pain following fall EXAM: CT HEAD WITHOUT CONTRAST TECHNIQUE: Contiguous axial images were obtained from the base of the skull through the vertex without intravenous contrast. COMPARISON:  Dec 17, 2013 FINDINGS: Brain: There is moderate diffuse atrophy. There is no intracranial mass, hemorrhage, extra-axial fluid collection, or midline shift. There is small vessel disease throughout the centra semiovale bilaterally. There is evidence of a prior infarct in the inferior right centrum semiovale, confined to white matter. There are small lacunar type infarcts in the right posterior lentiform nucleus and anterior right thalamus. There is no evident acute infarct. Vascular: No hyperdense vessel. There is calcification in each carotid siphon region. Skull: The bony calvarium appears intact. There is a left frontal scalp hematoma. Sinuses/Orbits: There is mucosal thickening involving several ethmoid air cells. Other visualized paranasal sinuses are  clear. Orbits appear symmetric bilaterally. Other: Mastoid air cells are clear. IMPRESSION: Atrophy with supratentorial small vessel disease. Prior white matter infarct in the right centrum semiovale. Small lacunar infarcts in the right thalamus and right posterior lentiform nucleus. No acute appearing infarct evident. No mass or hemorrhage. There are foci of arterial vascular calcification. There is a left frontal scalp hematoma. No fracture. There is mucosal thickening involving several ethmoid air cells bilaterally. Electronically Signed   By: Bretta Bang III M.D.   On: 05/11/2018 08:13    Procedures Procedures (including critical care time)  Medications Ordered in ED Medications - No data to display   Initial Impression / Assessment and Plan / ED Course  I have reviewed the triage vital signs and the nursing notes.  Pertinent labs & imaging results that were available during my care of  the patient were reviewed by me and considered in my medical decision making (see chart for details).     Patient with fall.  Hematoma on face.  Otherwise reassuring.  Discharge home.  Final Clinical Impressions(s) / ED Diagnoses   Final diagnoses:  Fall, initial encounter  Injury of head, initial encounter    ED Discharge Orders    None       Benjiman Core, MD 05/11/18 1534

## 2018-05-11 NOTE — ED Notes (Signed)
Bed: ZO10 Expected date:  Expected time:  Means of arrival:  Comments: EMS 84 yo fall with hematoma to head

## 2018-05-11 NOTE — ED Triage Notes (Addendum)
Pt arriving via [facility] from [facility] for unwitnessed fall. Pt has hematoma on forehead. Pt had recent fall that broke her hip and has not been ambulatory since. Pt has dementia and is alert only to self. Pt is at baseline per facility staff report.

## 2018-05-11 NOTE — ED Notes (Signed)
ED Provider at bedside. 

## 2018-05-11 NOTE — ED Notes (Signed)
[facility] called for transport.  

## 2018-05-11 NOTE — ED Notes (Signed)
[facility] arrived.  

## 2018-09-07 ENCOUNTER — Encounter: Payer: Self-pay | Admitting: Internal Medicine

## 2018-09-07 ENCOUNTER — Non-Acute Institutional Stay: Payer: Medicare Other | Admitting: Internal Medicine

## 2018-09-07 VITALS — BP 160/60 | HR 68 | Resp 16 | Ht 59.0 in | Wt 124.6 lb

## 2018-09-07 DIAGNOSIS — Z515 Encounter for palliative care: Secondary | ICD-10-CM

## 2018-09-07 NOTE — Progress Notes (Addendum)
Feb 6. 2020 [facility] Telephone: [phone] Fax: [phone]  PATIENT NAME: Felicia [name] DOB: Aug 24, 1927 MRN: [id number] [facility] RM [address] (adm 02/01/2018) [place] (Phone) [phone], ? (Fax) [phone], ?  (Fax) [phone]  PRIMARY CARE PROVIDER:  Kerman Passey, MD, [address], [place], [place] [address]  REFERRING PROVIDER: Cristina Gong, NP  RESPONSIBLE PARTY:  (dtr/HCPOA/PCPOA) Elizeth Sparling ([place])336 [phone], (son) Emelee Kieper [phone], (son) Aaniah Battley [phone], [phone]  IMPRESSION / RECOMMENDATIONS:  1. Dementia: FAST 6e. Patient needs assist with hygiene, dressing, and transfers (1 person). She is incontinent of bowel and bladder. She is constantly confused, and oriented to self only. Thought she was home in the house her husband built. Knows names of family members. She is able to converse in short phrases and is socially gracious. Some word repetition. She is chair bound, and is able to foot propel herself in wheelchair. Staff report good oral intake; patient able to feed herself. Her current weight is 124.6lbs, which is stable over the last year or so. At 4'11" her BMI is 25.2 kg/m2.  2. Goals of care: Spoke briefly with daughter over the phone; she will call me back when she has a little more time to talk.  3. Advanced Care Directives: DNR  4. F/U: NP visit in 1-2 months or prn earlier, pnd discussions with daughter.   I spent 60 minutes providing this consultation,  from 9am to 10am. More than 50% of the time in this consultation was spent coordinating communication, reconciling facility MAR with [facility] EMR.   HISTORY OF PRESENT ILLNESS:  Felicia [name] is a 85 y.o. female with medical h/o dementia, condyloma acuminatum, HLD, HTN, osteoporosis, rosacea, thalamic infarction, urinary retention (past placement suprapubic catheter), and vitamin D deficiency. She was seen in the ER Sept (closed fracture of multiple L  pubic rami), Oct (L scalp hematoma) 2019 for falls.  Palliative Care was asked to help address goals of care.   CODE STATUS: DNR (on chart;from 05/16/17)  PPS: 30%  HOSPICE ELIGIBILITY/DIAGNOSIS: no as prognosis thought greater than 6 months  PAST MEDICAL HISTORY:  Past Medical History:  Diagnosis Date  . Condyloma acuminatum   . Dementia (HCC)   . Hyperlipidemia   . Hypertension   . Osteoporosis   . Post-menopausal   . Rosacea   . Thalamic infarction (HCC)   . Urinary retention    chronic  . Vitamin D deficiency disease     SOCIAL HX:  Social History   Tobacco Use  . Smoking status: Never Smoker  . Smokeless tobacco: Never Used  . Tobacco comment: smoking cessation materials not required  Substance Use Topics  . Alcohol use: No    ALLERGIES: No Known Allergies   PERTINENT MEDICATIONS:  Outpatient Encounter Medications as of 09/07/2018  Medication Sig  . acetaminophen (TYLENOL) 500 MG tablet Take 500 mg by mouth every 6 (six) hours as needed for mild pain.   Marland Kitchen atorvastatin (LIPITOR) 20 MG tablet TAKE 1 TABLET BY MOUTH AT BEDTIME DAILY. (Patient taking differently: Take 20 mg by mouth at bedtime. )  . brimonidine (ALPHAGAN) 0.2 % ophthalmic solution Place 1 drop into both eyes 2 (two) times daily.  Marland Kitchen dextromethorphan-guaiFENesin (ROBITUSSIN-DM) 10-100 MG/5ML liquid Take 5 mLs by mouth every 4 (four) hours as needed for cough.  . dorzolamide-timolol (COSOPT) 22.3-6.8 MG/ML ophthalmic solution Place 1 drop into both eyes 2 (two) times daily.  Marland Kitchen  latanoprost (XALATAN) 0.005 % ophthalmic solution Place 1 drop into both eyes daily.  . Melatonin 3 MG TABS One by mouth every night at 9 pm (Patient taking differently: Take 3 mg by mouth at bedtime. )  . memantine (NAMENDA) 10 MG tablet TAKE 1 TABLET BY MOUTH TWICE DAILY FOR DEMENTIA (Patient taking differently: Take 10 mg by mouth 2 (two) times daily. )  . Netarsudil Dimesylate (RHOPRESSA) 0.02 % SOLN Apply 1 drop to eye daily. drp  into both eyes daily at 2pm  . traMADol (ULTRAM) 50 MG tablet Take 50 mg by mouth 3 (three) times daily as needed for moderate pain or severe pain.  Marland Kitchen VITAMIN B12 TR 1000 MCG TBCR TAKE 1 TABLET BY MOUTH EACH MORNING FOR SUPPLEMENT (Patient taking differently: Take 500 mcg by mouth daily. )  . Vitamin D, Ergocalciferol, (DRISDOL) 50000 units CAPS capsule Take 50,000 Units by mouth every 7 (seven) days.  . [DISCONTINUED] brimonidine (ALPHAGAN) 0.2 % ophthalmic solution Place 1 drop into both eyes 2 (two) times daily.  . [DISCONTINUED] dorzolamide-timolol (COSOPT) 22.3-6.8 MG/ML ophthalmic solution Place 1 drop into both eyes 2 (two) times daily.  . [DISCONTINUED] Sodium Fluoride (ACT RESTORING FLUORIDE RINSE MT) Use as directed 30 mLs in the mouth or throat at bedtime.   No facility-administered encounter medications on file as of 09/07/2018.     PHYSICAL EXAM:  VS BP 160/60, HR 68 RR 20 General: NAD, pleasantly conversant of short phrases. Cardiovascular: regular rate and rhythm Pulmonary: clear ant fields Abdomen: soft, nontender, + bowel sounds GU: no suprapubic tenderness Extremities: mild LE edema, no joint deformities Skin: no rashes Neurological: grossly nonfocal  Anselm Lis, NP

## 2018-09-13 ENCOUNTER — Telehealth: Payer: Self-pay | Admitting: Internal Medicine

## 2018-09-13 NOTE — Telephone Encounter (Signed)
9am: TC from daughter (and HCPOA) Felicia [name]:   Felicia PenSherry is unhappy with services patient is receiving at [facility], where patient is a current resident. Felicia PenSherry lives in [place] and would like advice regarding switching to another AL or even a memory care. I sent a consult request to our PC SW to assist.  Patient's husband Felicia [name] is currently receiving rehab at [facility]. When his rehab days are done, Felicia PenSherry anticipates he will be admitted to hospice. He was at [facility] for anticipated EOL care but buffed up. Felicia PenSherry would love for both her parents to have AL or MC at the same facility, and ideally in the same room. Felicia PenSherry would love the facility choice to be close to where she lives in [place], but would choose a facility in [place] if it better suited her parent's needs.I recommended to [facility] for her to request her dad to be referred to Palliative Care, so our NP can follow him and help facilitate his return to hospice, and perhaps be able to be proactive and get the facility SW to assist with long term care placement.  Felicia BodilyMary Serpe NP-C [phone]

## 2018-10-09 ENCOUNTER — Emergency Department (HOSPITAL_COMMUNITY)
Admission: EM | Admit: 2018-10-09 | Discharge: 2018-10-10 | Disposition: A | Discharge status: Home / Self Care | Payer: Medicare Other | Attending: Emergency Medicine | Admitting: Emergency Medicine

## 2018-10-09 ENCOUNTER — Emergency Department (HOSPITAL_COMMUNITY): Payer: Medicare Other

## 2018-10-09 DIAGNOSIS — R05 Cough: Secondary | ICD-10-CM | POA: Insufficient documentation

## 2018-10-09 DIAGNOSIS — N390 Urinary tract infection, site not specified: Secondary | ICD-10-CM | POA: Insufficient documentation

## 2018-10-09 DIAGNOSIS — F0390 Unspecified dementia without behavioral disturbance: Secondary | ICD-10-CM | POA: Diagnosis not present

## 2018-10-09 DIAGNOSIS — I10 Essential (primary) hypertension: Secondary | ICD-10-CM | POA: Insufficient documentation

## 2018-10-09 DIAGNOSIS — R059 Cough, unspecified: Secondary | ICD-10-CM

## 2018-10-09 DIAGNOSIS — Z79899 Other long term (current) drug therapy: Secondary | ICD-10-CM | POA: Diagnosis not present

## 2018-10-09 DIAGNOSIS — R509 Fever, unspecified: Secondary | ICD-10-CM | POA: Diagnosis present

## 2018-10-09 NOTE — ED Notes (Signed)
Bed: WA01 Expected date:  Expected time:  Means of arrival:  Comments: 85 yo F/SNF-Fever

## 2018-10-09 NOTE — ED Triage Notes (Signed)
Pt BIB [facility] from [facility]. Pt was found to have a fever 101.1 today. Facility is unsure of onset. No complaints. V/S stable ans within normal limits.

## 2018-10-10 LAB — URINALYSIS, ROUTINE W REFLEX MICROSCOPIC
BILIRUBIN URINE: NEGATIVE
GLUCOSE, UA: NEGATIVE mg/dL
KETONES UR: NEGATIVE mg/dL
NITRITE: NEGATIVE
PROTEIN: 100 mg/dL — AB
SPECIFIC GRAVITY, URINE: 1.018 (ref 1.005–1.030)
pH: 5 (ref 5.0–8.0)

## 2018-10-10 LAB — BASIC METABOLIC PANEL
ANION GAP: 6 (ref 5–15)
BUN: 22 mg/dL (ref 8–23)
CALCIUM: 9 mg/dL (ref 8.9–10.3)
CHLORIDE: 102 mmol/L (ref 98–111)
CO2: 30 mmol/L (ref 22–32)
Creatinine, Ser: 0.96 mg/dL (ref 0.44–1.00)
GFR calc Af Amer: >60 mL/min (ref >60)
GFR calc non Af Amer: 52 mL/min — ABNORMAL LOW (ref >60)
GLUCOSE: 115 mg/dL — AB (ref 70–99)
Potassium: 3.6 mmol/L (ref 3.5–5.1)
Sodium: 138 mmol/L (ref 135–145)

## 2018-10-10 LAB — CBC WITH DIFFERENTIAL/PLATELET
Abs Immature Granulocytes: 0.05 10*3/uL (ref 0.00–0.07)
Basophils Absolute: 0 10*3/uL (ref 0.0–0.1)
Basophils Relative: 0 %
EOS ABS: 0 10*3/uL (ref 0.0–0.5)
EOS PCT: 0 %
HEMATOCRIT: 42.4 % (ref 36.0–46.0)
HEMOGLOBIN: 13.6 g/dL (ref 12.0–15.0)
IMMATURE GRANULOCYTES: 1 %
LYMPHS ABS: 1.2 10*3/uL (ref 0.7–4.0)
LYMPHS PCT: 11 %
MCH: 30.4 pg (ref 26.0–34.0)
MCHC: 32.1 g/dL (ref 30.0–36.0)
MCV: 94.6 fL (ref 80.0–100.0)
MONOS PCT: 15 %
Monocytes Absolute: 1.6 10*3/uL — ABNORMAL HIGH (ref 0.1–1.0)
Neutro Abs: 7.9 10*3/uL — ABNORMAL HIGH (ref 1.7–7.7)
Neutrophils Relative %: 73 %
Platelets: 252 10*3/uL (ref 150–400)
RBC: 4.48 MIL/uL (ref 3.87–5.11)
RDW: 12.7 % (ref 11.5–15.5)
WBC: 10.7 10*3/uL — ABNORMAL HIGH (ref 4.0–10.5)
nRBC: 0 % (ref 0.0–0.2)

## 2018-10-10 LAB — INFLUENZA PANEL BY PCR (TYPE A & B)
Influenza A By PCR: NEGATIVE
Influenza B By PCR: NEGATIVE

## 2018-10-10 MED ORDER — CEPHALEXIN 500 MG PO CAPS: 500.0000 mg | ORAL_CAPSULE | Freq: Two times a day (BID) | ORAL | 0 refills | Status: AC

## 2018-10-10 NOTE — ED Provider Notes (Signed)
[facility]-EMERGENCY DEPT Provider Note  CSN: [id number] Arrival date & time: 10/09/18 2046  Chief Complaint(s) Fever  HPI Felicia [name] is a 85 y.o. female with a history of dementia who lives in a memory care unit who presents to the emergency department for possible seizures.  They checked patient's temperature and noted temperature of 101.1.  I called the facility and the med tech stated that she believes the patient had a cough last Friday.  No other symptoms noticed.  Patient is at her baseline mental status.  Remainder of history, ROS, and physical exam limited due to patient's condition (dementia). Additional information was obtained from sNF.   Level V Caveat.  Attempt to contact family unsuccessful.  HPI  Past Medical History Past Medical History:  Diagnosis Date  . Condyloma acuminatum   . Dementia (HCC)   . Hyperlipidemia   . Hypertension   . Osteoporosis   . Post-menopausal   . Rosacea   . Thalamic infarction (HCC)   . Urinary retention    chronic  . Vitamin D deficiency disease    Patient Active Problem List   Diagnosis Date Noted  . Elevated red blood cell count 06/13/2017  . Weight loss 05/16/2017  . Gait instability 05/16/2017  . At moderate risk for fall 02/26/2016  . DNAR (do not attempt resuscitation) 10/15/2015  . Weakness 10/15/2015  . Generalized weakness 09/21/2015  . Medication monitoring encounter 03/21/2015  . Breast cancer screening 03/21/2015  . Rosacea   . Hyperlipidemia   . Hypertension   . Osteoporosis   . Dementia (HCC)   . Vitamin D deficiency disease   . Condyloma acuminatum   . Thalamic infarction (HCC)    Home Medication(s) Prior to Admission medications   Medication Sig Start Date End Date Taking? Authorizing Provider  acetaminophen (TYLENOL) 500 MG tablet Take 500 mg by mouth every 6 (six) hours as needed for mild pain.  06/30/17   Lada, Janit Bern, MD  atorvastatin (LIPITOR) 20 MG tablet TAKE 1 TABLET  BY MOUTH AT BEDTIME DAILY. Patient taking differently: Take 20 mg by mouth at bedtime.  01/10/18   Lada, Janit Bern, MD  brimonidine (ALPHAGAN) 0.2 % ophthalmic solution Place 1 drop into both eyes 2 (two) times daily.    [provider]  cephALEXin (KEFLEX) 500 MG capsule Take 1 capsule (500 mg total) by mouth 2 (two) times daily for 7 days. 10/10/18 10/17/18  Nira Conn, MD  dextromethorphan-guaiFENesin (ROBITUSSIN-DM) 10-100 MG/5ML liquid Take 5 mLs by mouth every 4 (four) hours as needed for cough.    [provider]  dorzolamide-timolol (COSOPT) 22.3-6.8 MG/ML ophthalmic solution Place 1 drop into both eyes 2 (two) times daily.    [provider]  latanoprost (XALATAN) 0.005 % ophthalmic solution Place 1 drop into both eyes daily. 03/12/17   [provider]  Melatonin 3 MG TABS One by mouth every night at 9 pm Patient taking differently: Take 3 mg by mouth at bedtime.  02/06/18   Lada, Janit Bern, MD  memantine (NAMENDA) 10 MG tablet TAKE 1 TABLET BY MOUTH TWICE DAILY FOR DEMENTIA Patient taking differently: Take 10 mg by mouth 2 (two) times daily.  04/12/18   Lada, Janit Bern, MD  Netarsudil Dimesylate (RHOPRESSA) 0.02 % SOLN Apply 1 drop to eye daily. drp into both eyes daily at 2pm    [provider]  traMADol (ULTRAM) 50 MG tablet Take 50 mg by mouth 3 (three) times daily as  needed for moderate pain or severe pain.    [provider]  VITAMIN B12 TR 1000 MCG TBCR TAKE 1 TABLET BY MOUTH EACH MORNING FOR SUPPLEMENT Patient taking differently: Take 500 mcg by mouth daily.  11/10/17   Kerman Passey, MD  Vitamin D, Ergocalciferol, (DRISDOL) 50000 units CAPS capsule Take 50,000 Units by mouth every 7 (seven) days.    [provider]                                                                                                                                    Past Surgical History Past Surgical History:  Procedure Laterality  Date  . SUPRAPUBIC CATHETER PLACEMENT  Nov. 2014   Family History Family History  Problem Relation Age of Onset  . Arthritis Mother   . CAD Father   . Arthritis Father   . Arthritis Sister   . Arthritis Brother   . Arthritis Daughter   . Arthritis Son   . Ovarian cancer Neg Hx     Social History Social History   Tobacco Use  . Smoking status: Never Smoker  . Smokeless tobacco: Never Used  . Tobacco comment: smoking cessation materials not required  Substance Use Topics  . Alcohol use: No  . Drug use: No   Allergies Patient has no known allergies.  Review of Systems Review of Systems  Unable to perform ROS: Dementia    Physical Exam Vital Signs  I have reviewed the triage vital signs BP 136/80   Pulse 63   Temp 99.5 F (37.5 C) (Rectal)   Resp (!) 24   SpO2 97%   Physical Exam Vitals signs reviewed.  Constitutional:      General: She is not in acute distress.    Appearance: She is well-developed. She is not diaphoretic.  HENT:     Head: Normocephalic and atraumatic.     Nose: Nose normal.  Eyes:     General: No scleral icterus.       Right eye: No discharge.        Left eye: No discharge.     Conjunctiva/sclera: Conjunctivae normal.     Pupils: Pupils are equal, round, and reactive to light.  Neck:     Musculoskeletal: Normal range of motion and neck supple.  Cardiovascular:     Rate and Rhythm: Normal rate and regular rhythm.     Heart sounds: No murmur. No friction rub. No gallop.   Pulmonary:     Effort: Pulmonary effort is normal. No respiratory distress.     Breath sounds: Normal breath sounds. No stridor. No rales.  Abdominal:     General: There is no distension.     Palpations: Abdomen is soft.     Tenderness: There is no abdominal tenderness.  Musculoskeletal:        General: No tenderness.  Skin:    General: Skin is warm and dry.  Findings: No erythema, lesion, rash or wound.  Neurological:     Mental Status: She is alert. She is  disoriented.     ED Results and Treatments Labs (all labs ordered are listed, but only abnormal results are displayed) Labs Reviewed  CBC WITH DIFFERENTIAL/PLATELET - Abnormal; Notable for the following components:      Result Value   WBC 10.7 (*)    Neutro Abs 7.9 (*)    Monocytes Absolute 1.6 (*)    All other components within normal limits  BASIC METABOLIC PANEL - Abnormal; Notable for the following components:   Glucose, Bld 115 (*)    GFR calc non Af Amer 52 (*)    All other components within normal limits  URINALYSIS, ROUTINE W REFLEX MICROSCOPIC - Abnormal; Notable for the following components:   Color, Urine AMBER (*)    APPearance CLOUDY (*)    Hgb urine dipstick SMALL (*)    Protein, ur 100 (*)    Leukocytes,Ua LARGE (*)    WBC, UA >50 (*)    Bacteria, UA FEW (*)    All other components within normal limits  INFLUENZA PANEL BY PCR (TYPE A & B)                                                                                                                         EKG  EKG Interpretation  Date/Time:    Ventricular Rate:    PR Interval:    QRS Duration:   QT Interval:    QTC Calculation:   R Axis:     Text Interpretation:        Radiology Dg Chest 2 View  Result Date: 10/10/2018 CLINICAL DATA:  Fever today. History of dementia and hypertension. Nonsmoker. EXAM: CHEST - 2 VIEW COMPARISON:  04/13/2018 FINDINGS: Normal heart size and pulmonary vascularity. No focal airspace disease or consolidation in the lungs. No blunting of costophrenic angles. No pneumothorax. Mediastinal contours appear intact. Calcification of the aorta. Degenerative changes in the spine. IMPRESSION: No active cardiopulmonary disease. Electronically Signed   By: Burman Nieves M.D.   On: 10/10/2018 00:24   Pertinent labs & imaging results that were available during my care of the patient were reviewed by me and considered in my medical decision making (see chart for details).  Medications  Ordered in ED Medications - No data to display  Procedures Procedures  (including critical care time)  Medical Decision Making / ED Course I have reviewed the nursing notes for this encounter and the patient's prior records (if available in EHR or on provided paperwork).    Work-up reassuring with mild leukocytosis.  Evidence of urinary tract infection.  Influenza negative.  Chest x-ray negative.  Will treat outpatient with Keflex.  Final Clinical Impression(s) / ED Diagnoses Final diagnoses:  Cough  Lower urinary tract infectious disease    Disposition: Discharge  Condition: Good    ED Discharge Orders         Ordered    cephALEXin (KEFLEX) 500 MG capsule  2 times daily     10/10/18 0124           Follow Up: Primary care provider  Schedule an appointment as soon as possible for a visit       This chart was dictated using voice recognition software.  Despite best efforts to proofread,  errors can occur which can change the documentation meaning.   Nira Conn, MD 10/10/18 [phone]

## 2019-06-04 IMAGING — CT CT HIP*L* W/O CM
2 of 3 series · 17 of 46 positions shown, 19 images · non-contrast
Comparison: None.

CLINICAL DATA: Left hip pain status post injury.

EXAM:
CT OF THE LEFT HIP WITHOUT CONTRAST
TECHNIQUE: Multidetector CT imaging of the left hip was performed according to
the standard protocol. Multiplanar CT image reconstructions were
also generated.

[Series 3: axial st · axial · 0.49mm/px · z∈[-493,-331]mm · 14 of 95 slices shown, 16 images]
[im 7/95  soft-tissue]
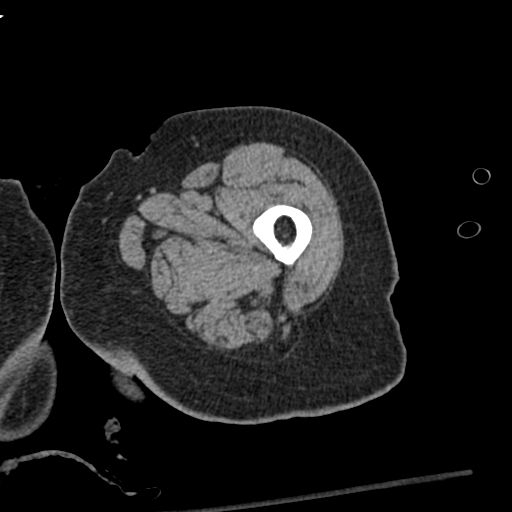
[im 7/95  bone]
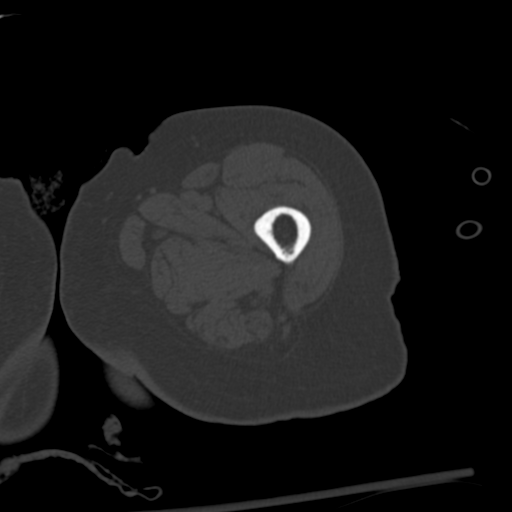
[im 13/95  soft-tissue]
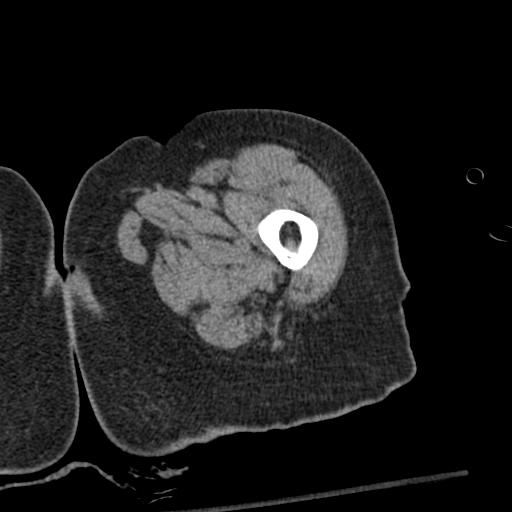
[im 19/95  soft-tissue]
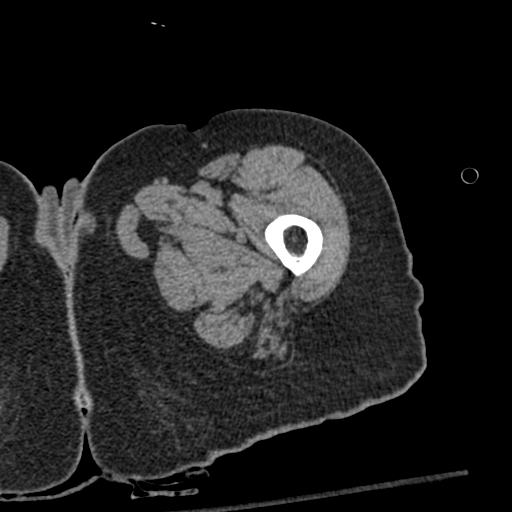
[im 25/95  soft-tissue]
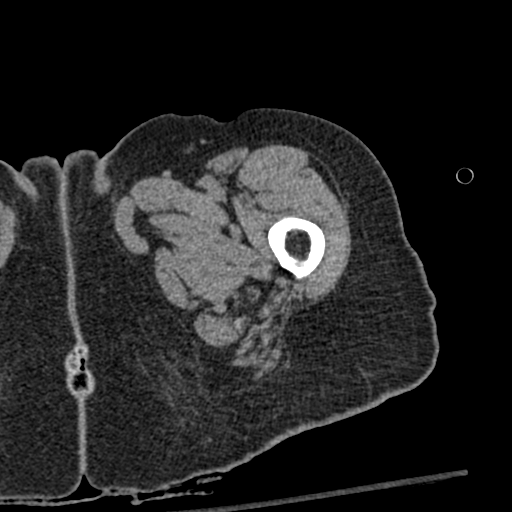
[im 31/95  soft-tissue]
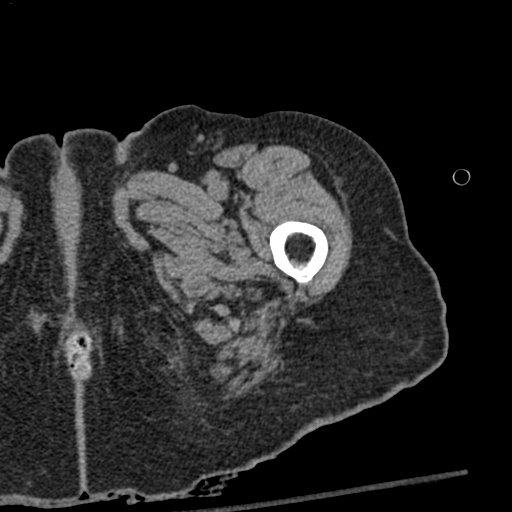
[im 37/95  soft-tissue]
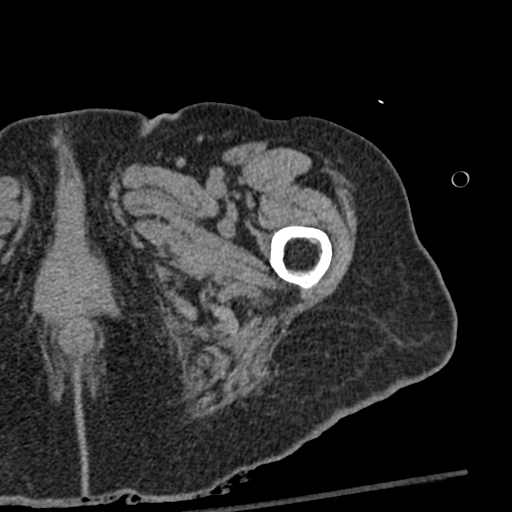
[im 43/95  soft-tissue]
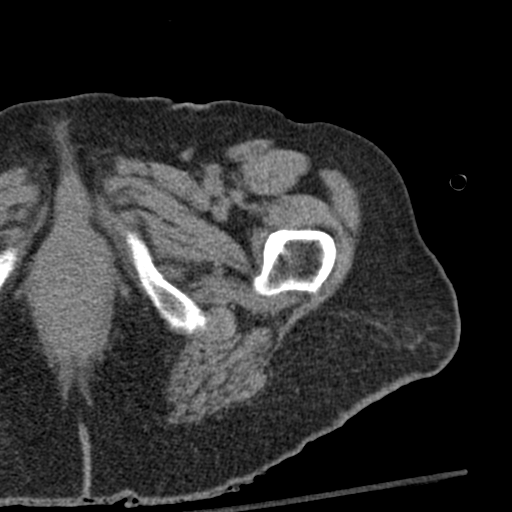
[im 52/95  soft-tissue]
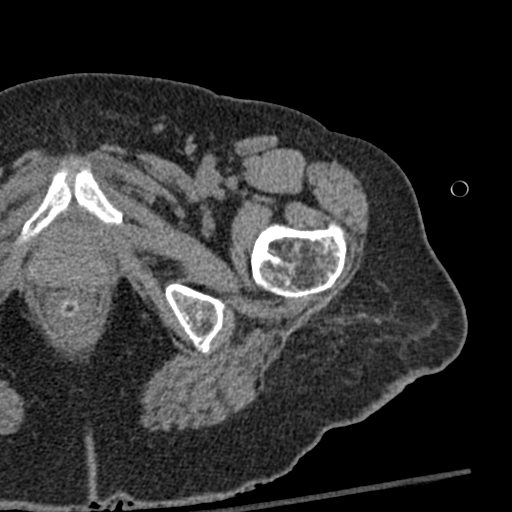
[im 58/95  soft-tissue]
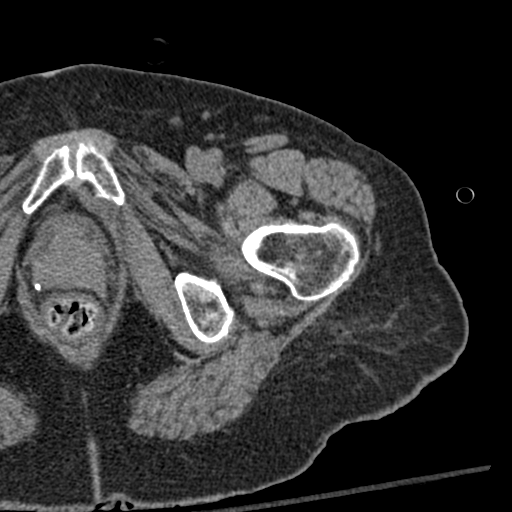
[im 58/95  bone]
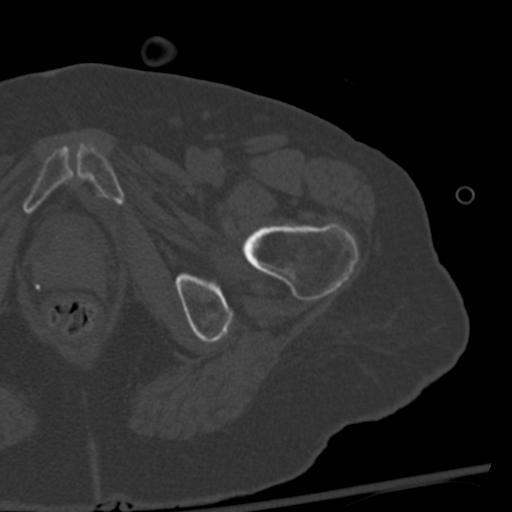
[im 64/95  soft-tissue]
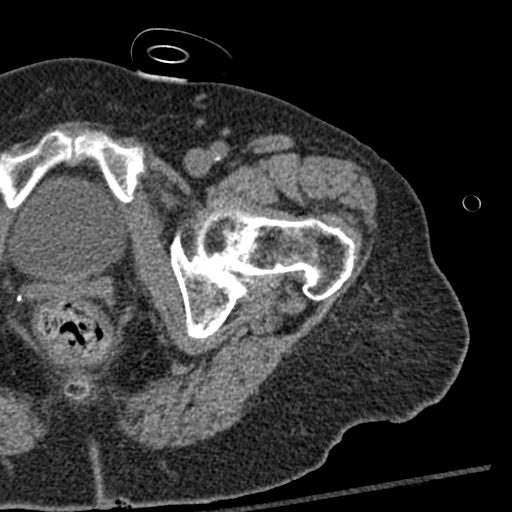
[im 70/95  soft-tissue]
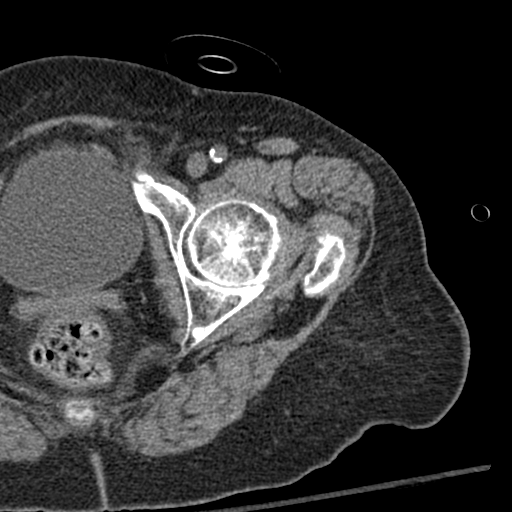
[im 76/95  soft-tissue]
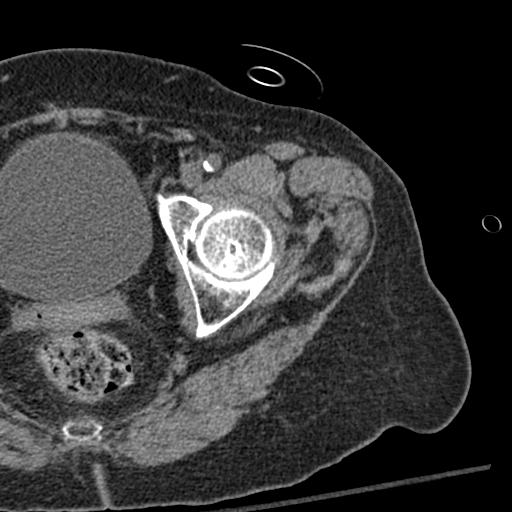
[im 82/95  soft-tissue]
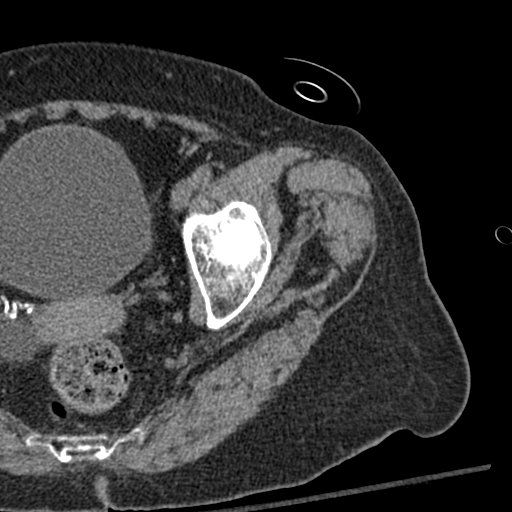
[im 88/95  soft-tissue]
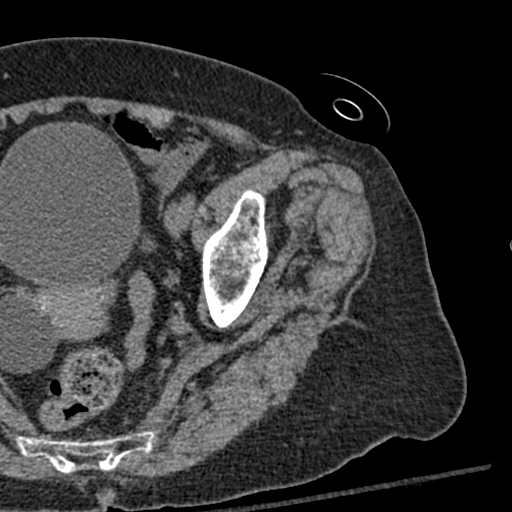

[Series 8: coronal st · coronal · 0.39mm/px · 3 of 88 slices shown]
[im 30/88  soft-tissue]
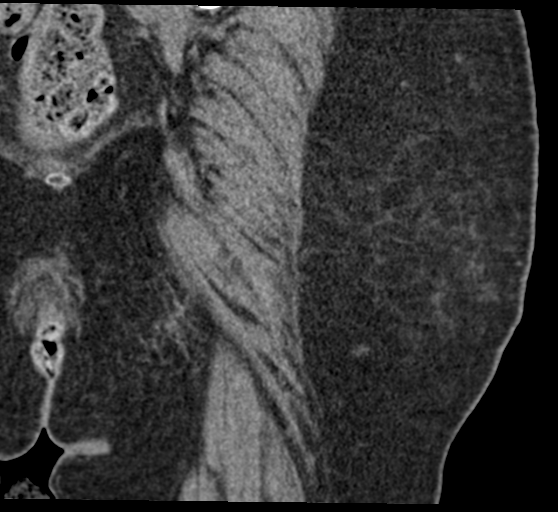
[im 39/88  soft-tissue]
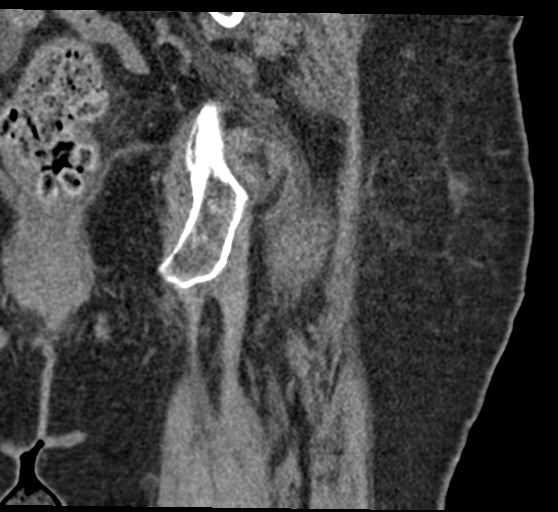
[im 49/88  soft-tissue]
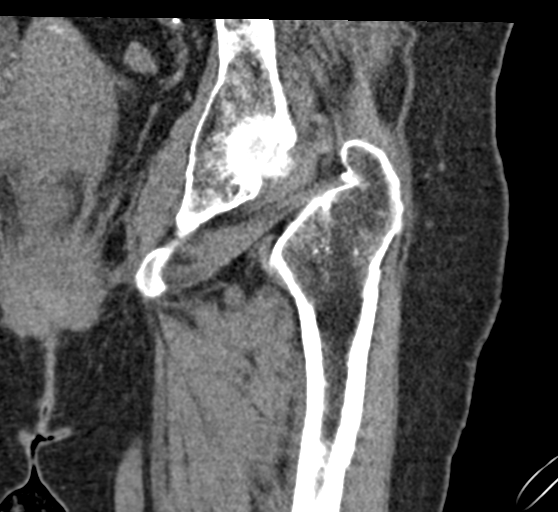

[17 of 46 positions shown; findings below may reference images not displayed]

FINDINGS: Bones/Joint/Cartilage

Generalized osteopenia.

No left hip fracture or dislocation. Joint space narrowing of left
hip joint space with marginal osteophytes and subchondral cystic
changes most consistent with mild-moderate osteoarthritis. There is
relative cortical irregularity on the lateral margin of proximal
left femoral diaphysis likely reflecting artifact secondary to
patient motion.

Acute nondisplaced left superior pubic ramus fracture. Acute
nondisplaced left inferior pubic ramus fracture.

Ligaments

Suboptimally assessed by CT.

Muscles and Tendons

Muscles are normal. No muscle atrophy. No intramuscular fluid
collection or hematoma.

Soft tissues

No fluid collection or hematoma. Peripheral vascular atherosclerotic
disease.
IMPRESSION: 1. Acute nondisplaced fractures of the left superior and inferior
pubic rami.
2.  No acute osseous injury of the left proximal femur.
3. Mild-moderate osteoarthritis of the left hip.

## 2019-06-04 IMAGING — CR DG LUMBAR SPINE COMPLETE 4+V
6 series · 6 of 6 positions shown · non-contrast
Comparison: CT 09/18/2015

CLINICAL DATA: Fell at [HOSPITAL] today.  Back and hip pain.

EXAM:
LUMBAR SPINE - COMPLETE 4+ VIEW

[t lumbar spine ap]
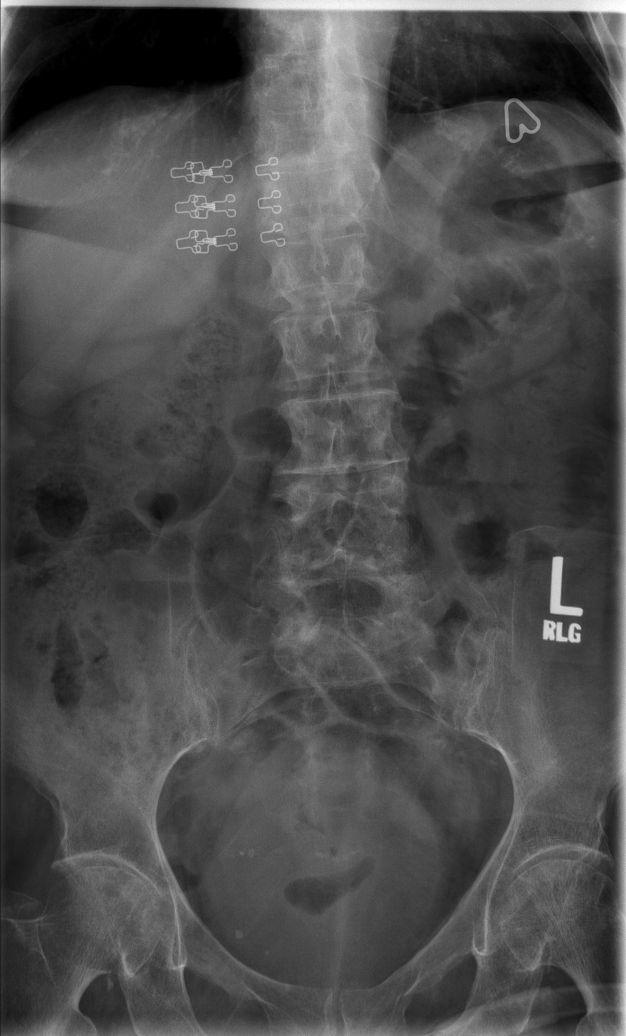

[t lumbar spine obl (1 of 3)]
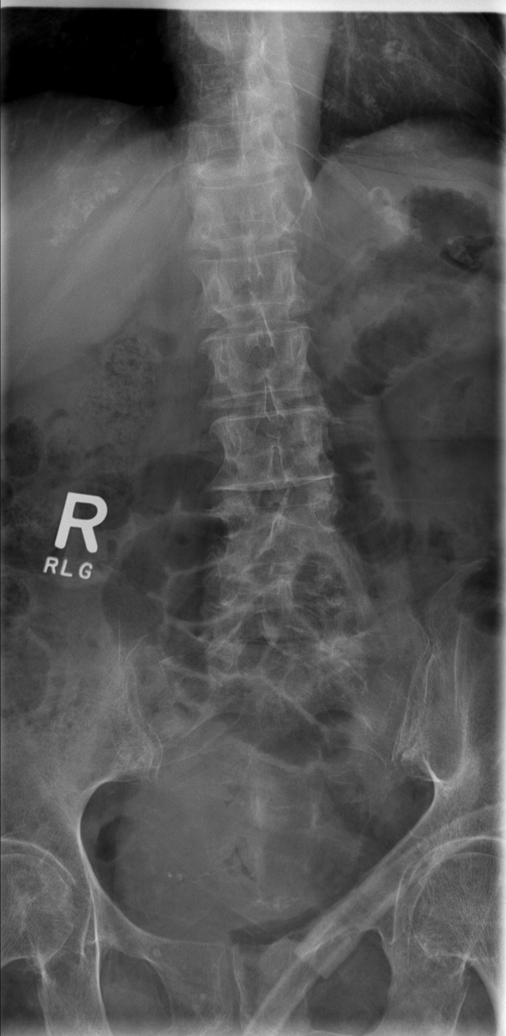

[t lumbar spine obl (2 of 3)]
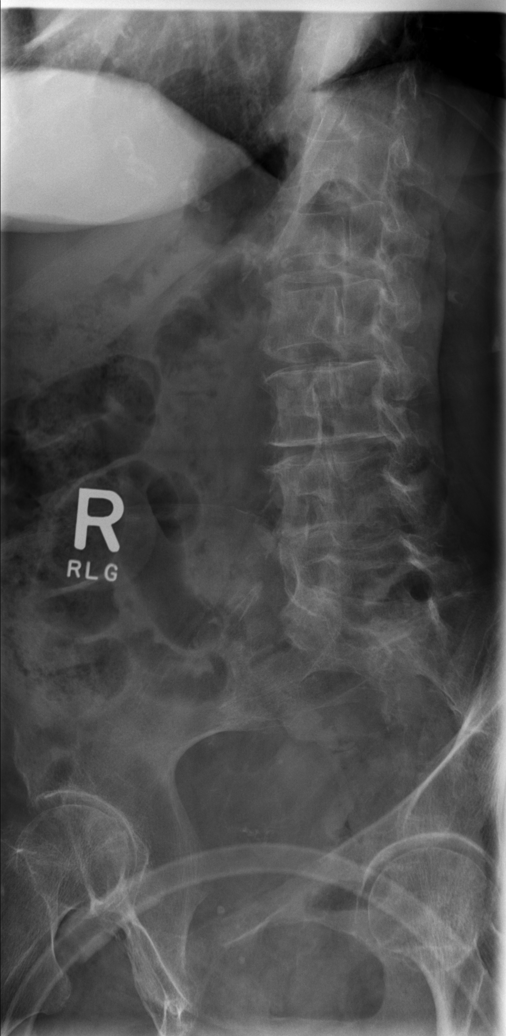

[t lumbar spine obl (3 of 3)]
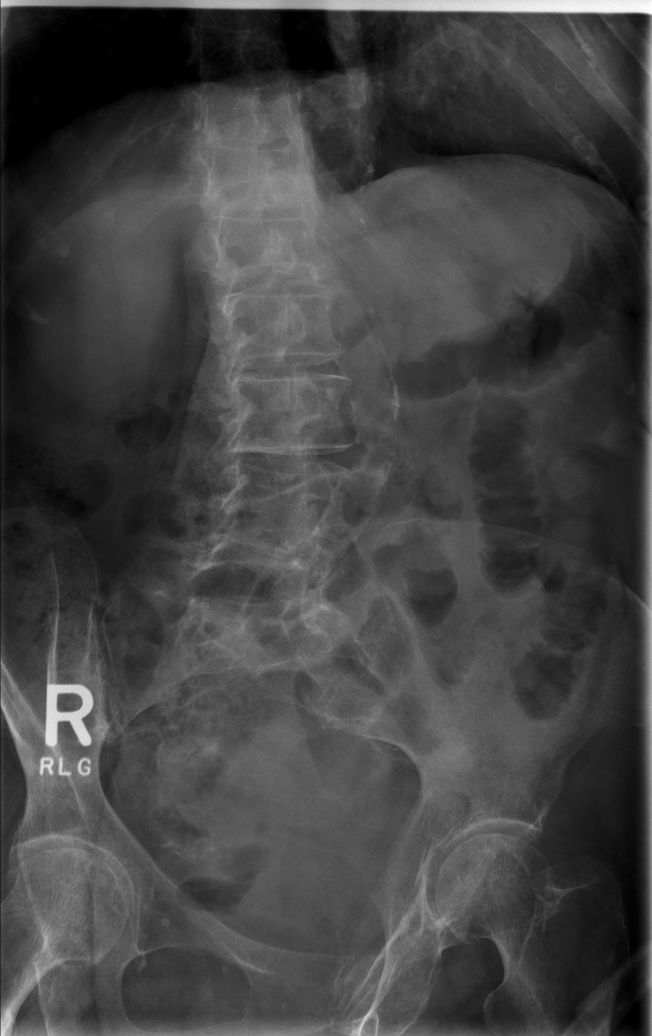

[t lumbar spine lat]
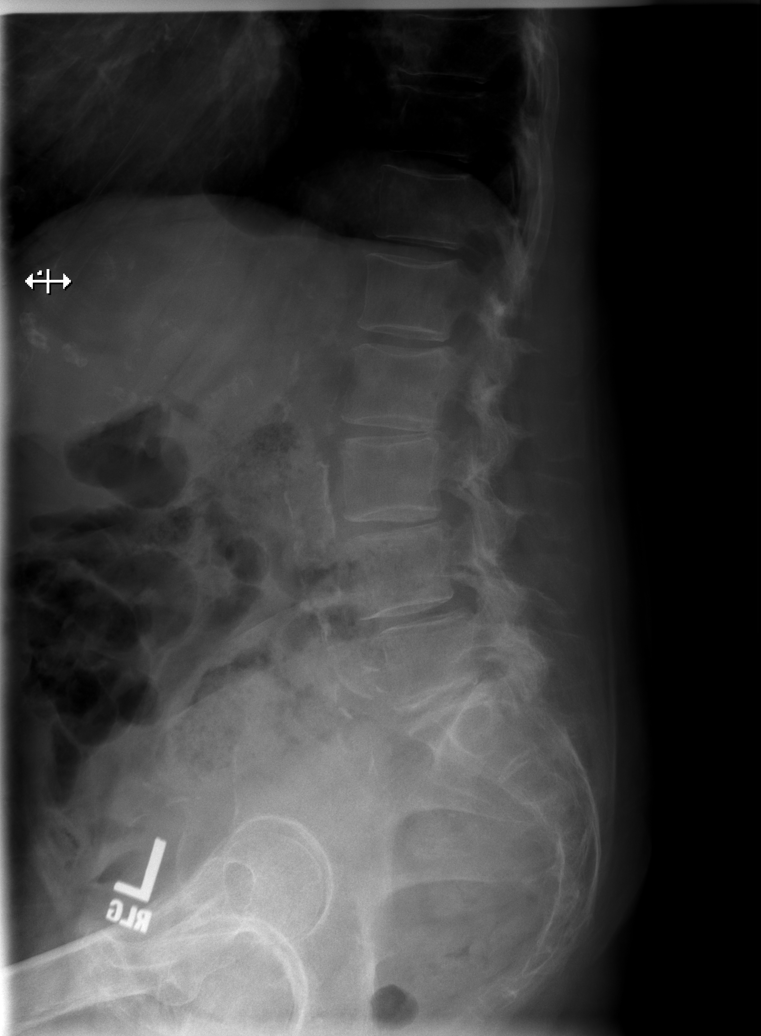

[t lumbar l-5 s-1 spot]
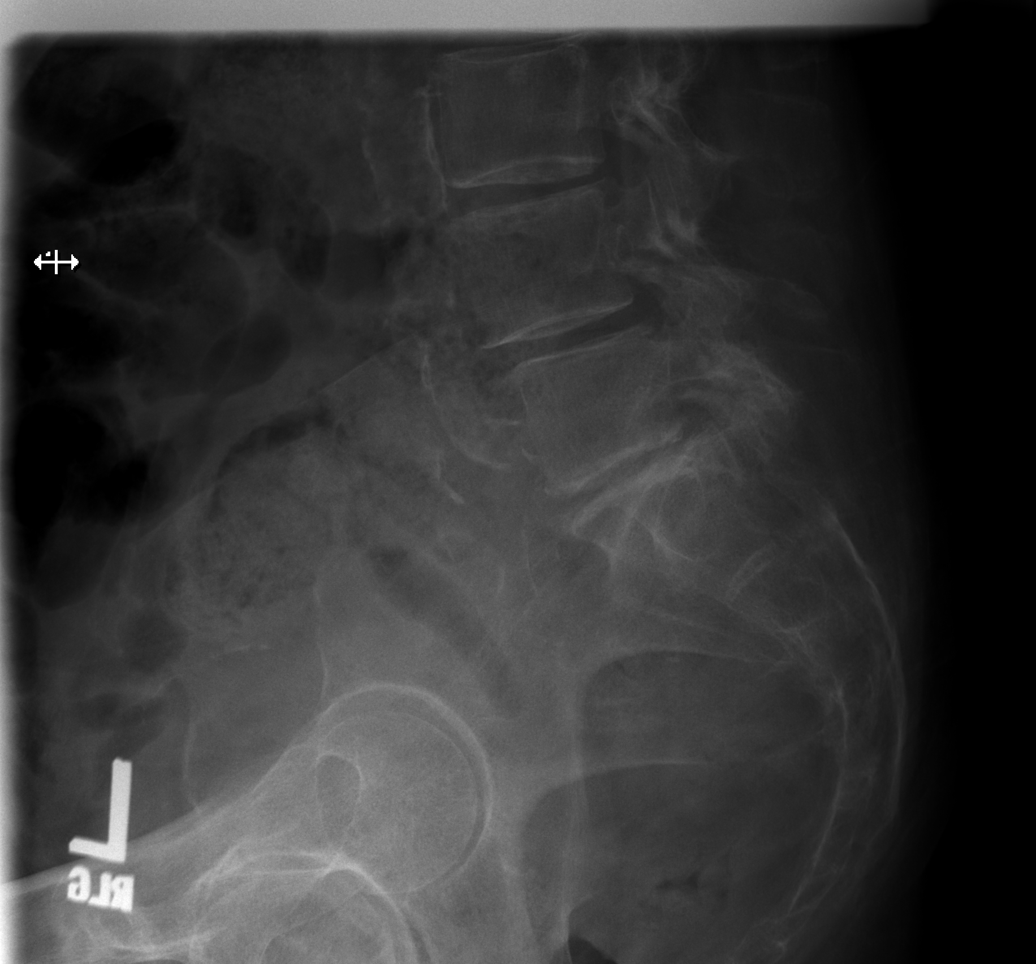

[6 of 6 positions shown; findings below may reference images not displayed]

FINDINGS: Five lumbar type vertebral bodies. No traumatic malalignment.
Degenerative anterolisthesis at L4-5 of 5 mm due to facet
arthropathy. Lower lumbar degenerative disc disease and degenerative
facet disease. No evidence of lumbar vertebral fracture or
sacrococcygeal fracture.
IMPRESSION: Chronic degenerative changes as above. No acute or traumatic finding
in the lumbar region.

## 2019-12-01 IMAGING — CR CHEST - 2 VIEW
2 series · 2 of 2 positions shown · non-contrast
Comparison: 04/13/2018

CLINICAL DATA: Fever today. History of dementia and hypertension.
Nonsmoker.

EXAM:
CHEST - 2 VIEW

[w chest lat]
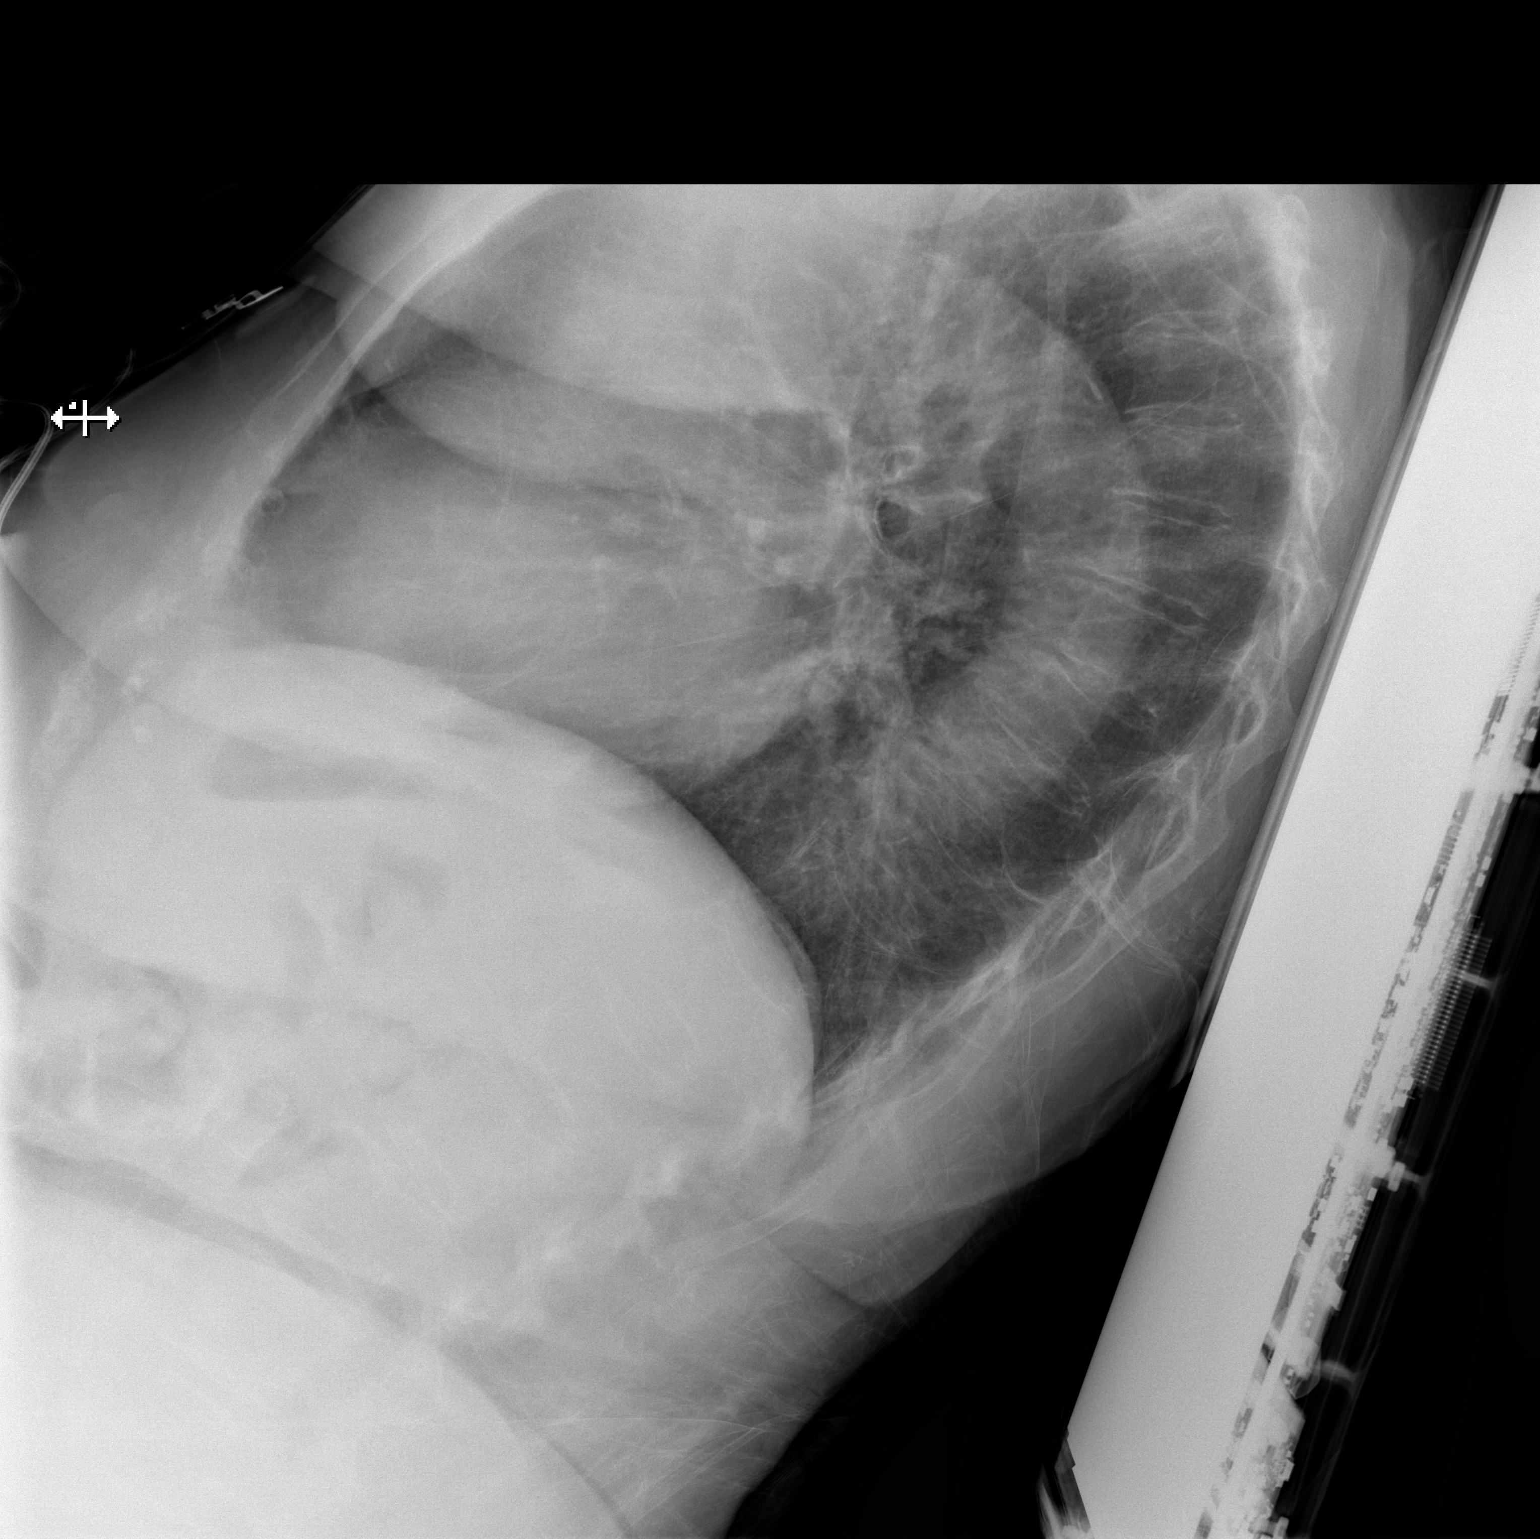

[x chest ap]
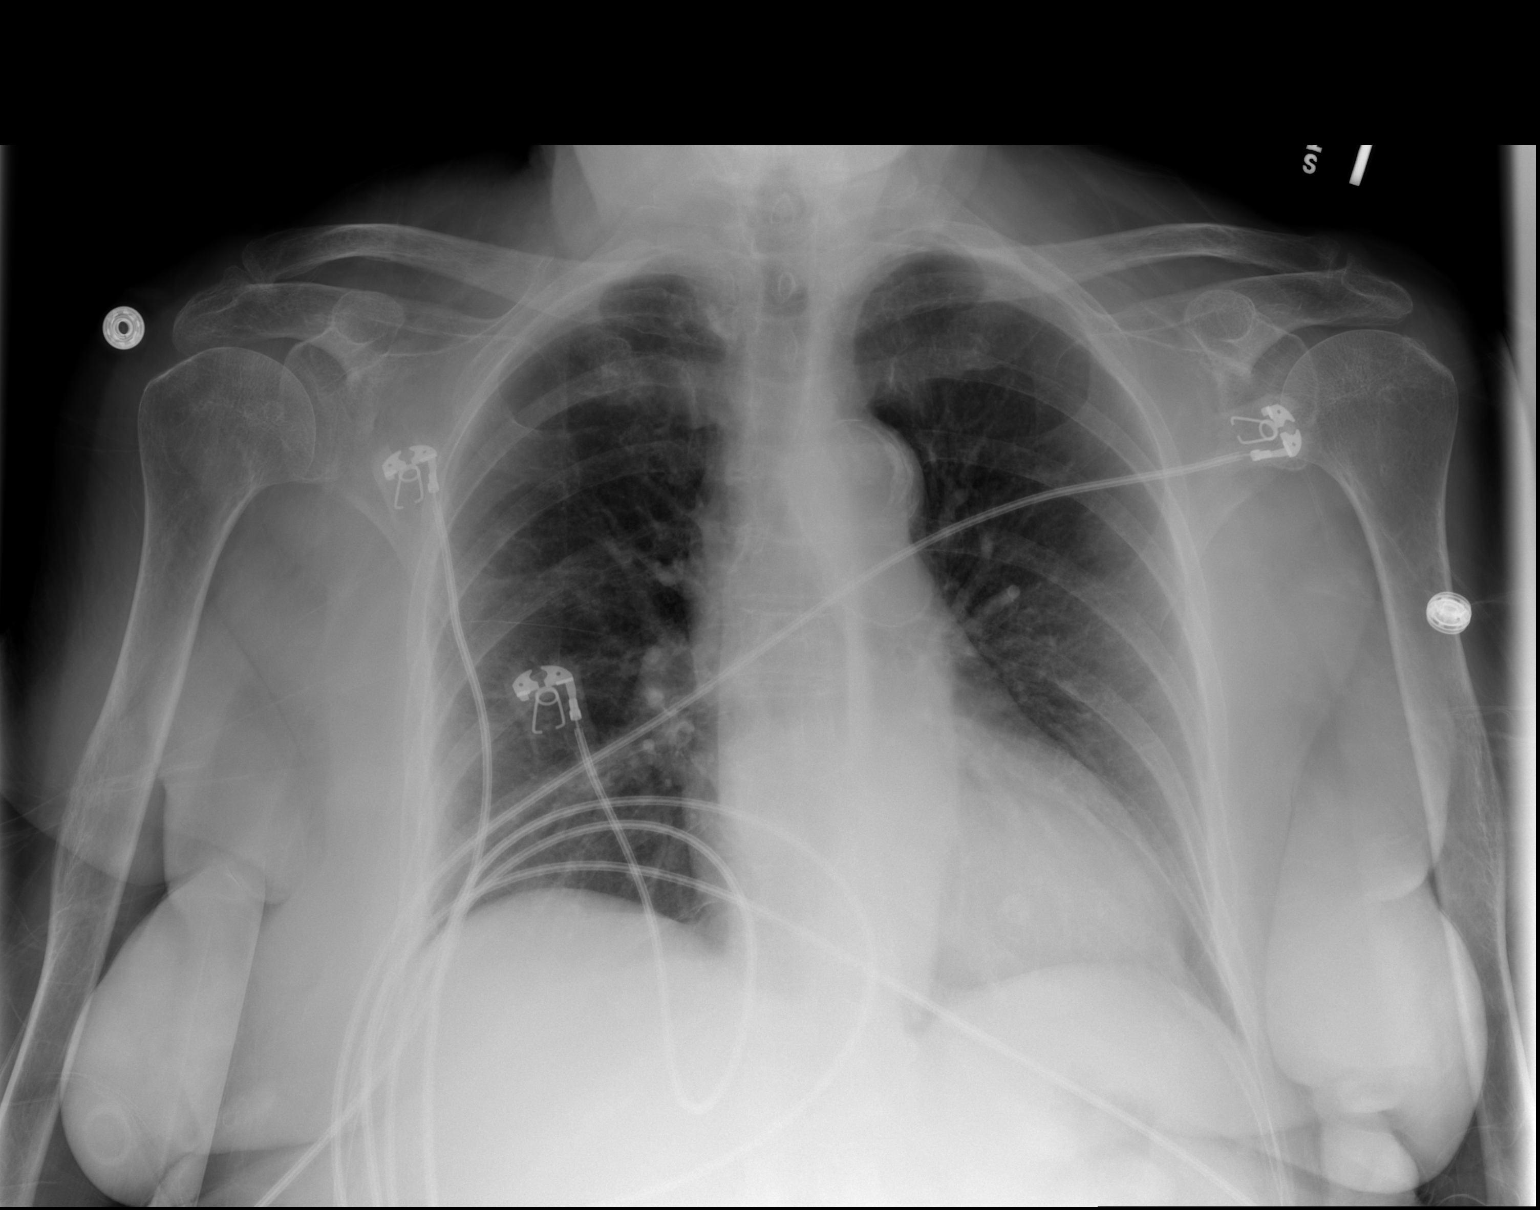

[2 of 2 positions shown; findings below may reference images not displayed]

FINDINGS: Normal heart size and pulmonary vascularity. No focal airspace
disease or consolidation in the lungs. No blunting of costophrenic
angles. No pneumothorax. Mediastinal contours appear intact.
Calcification of the aorta. Degenerative changes in the spine.
IMPRESSION: No active cardiopulmonary disease.

## 2020-04-24 ENCOUNTER — Emergency Department (HOSPITAL_COMMUNITY)
Admission: EM | Admit: 2020-04-24 | Discharge: 2020-04-24 | Disposition: A | Discharge status: Home / Self Care | Attending: Emergency Medicine | Admitting: Emergency Medicine

## 2020-04-24 ENCOUNTER — Encounter (HOSPITAL_COMMUNITY): Payer: Self-pay

## 2020-04-24 DIAGNOSIS — F0390 Unspecified dementia without behavioral disturbance: Secondary | ICD-10-CM | POA: Diagnosis not present

## 2020-04-24 DIAGNOSIS — W07XXXA Fall from chair, initial encounter: Secondary | ICD-10-CM | POA: Diagnosis not present

## 2020-04-24 DIAGNOSIS — Y9389 Activity, other specified: Secondary | ICD-10-CM | POA: Insufficient documentation

## 2020-04-24 DIAGNOSIS — S0083XA Contusion of other part of head, initial encounter: Secondary | ICD-10-CM | POA: Diagnosis not present

## 2020-04-24 DIAGNOSIS — S0990XA Unspecified injury of head, initial encounter: Secondary | ICD-10-CM | POA: Diagnosis present

## 2020-04-24 DIAGNOSIS — Y92009 Unspecified place in unspecified non-institutional (private) residence as the place of occurrence of the external cause: Secondary | ICD-10-CM | POA: Diagnosis not present

## 2020-04-24 DIAGNOSIS — I10 Essential (primary) hypertension: Secondary | ICD-10-CM | POA: Diagnosis not present

## 2020-04-24 MED ORDER — ACETAMINOPHEN 325 MG PO TABS
650.0000 mg | ORAL_TABLET | Freq: Once | ORAL | Status: AC
  Administered 2020-04-24: 650 mg via ORAL
  Filled 2020-04-24: qty 2

## 2020-04-24 NOTE — ED Notes (Signed)
Assume care from EMS, EMS reports [facility] where pt had an unwitness fall out of the wheel chair with no LOC. EMS reports pt is not on blood thinners is hospice with an DNR in place and hx of dementia and is nonverbal at baseline. Upon arrival to hallbed MD evaluated pt with no distress however just present to the ED with an forehead hematoma

## 2020-04-24 NOTE — ED Notes (Signed)
Pt is d/c by MD, Pt is returning back to facility with d/c instructions

## 2020-04-24 NOTE — ED Provider Notes (Signed)
[facility] EMERGENCY DEPARTMENT Provider Note   CSN: [id number] Arrival date & time: 04/24/20  1941     History Chief Complaint  Patient presents with  . Fall    Felicia [name] is a 86 y.o. female.  Patient with hx advanced dementia, presents via EMS s/p witness fall at [facility]. Pt fell from chair, bumped forehead, no LOC noted. At baseline pt not verbally conversant. Patient has remained alert, with mental status c/w baseline since fall. No vomiting. Pt limited historian given advanced dementia - level 5 caveat. Pt arrives to ED via EMS, and appears in no acute pain or distress.   The history is provided by the patient, the EMS personnel, the nursing home and medical records. The history is limited by the condition of the patient.  Fall       Past Medical History:  Diagnosis Date  . Condyloma acuminatum   . Dementia (HCC)   . Hyperlipidemia   . Hypertension   . Osteoporosis   . Post-menopausal   . Rosacea   . Thalamic infarction (HCC)   . Urinary retention    chronic  . Vitamin D deficiency disease     Patient Active Problem List   Diagnosis Date Noted  . Elevated red blood cell count 06/13/2017  . Weight loss 05/16/2017  . Gait instability 05/16/2017  . At moderate risk for fall 02/26/2016  . DNAR (do not attempt resuscitation) 10/15/2015  . Weakness 10/15/2015  . Generalized weakness 09/21/2015  . Medication monitoring encounter 03/21/2015  . Breast cancer screening 03/21/2015  . Rosacea   . Hyperlipidemia   . Hypertension   . Osteoporosis   . Dementia (HCC)   . Vitamin D deficiency disease   . Condyloma acuminatum   . Thalamic infarction [facility])     Past Surgical History:  Procedure Laterality Date  . SUPRAPUBIC CATHETER PLACEMENT  Nov. 2014     OB History   No obstetric history on file.     Family History  Problem Relation Age of Onset  . Arthritis Mother   . CAD Father   . Arthritis Father   . Arthritis Sister   . Arthritis  Brother   . Arthritis Daughter   . Arthritis Son   . Ovarian cancer Neg Hx     Social History   Tobacco Use  . Smoking status: Never Smoker  . Smokeless tobacco: Never Used  . Tobacco comment: smoking cessation materials not required  Vaping Use  . Vaping Use: Never used  Substance Use Topics  . Alcohol use: No  . Drug use: No    Home Medications Prior to Admission medications   Medication Sig Start Date End Date Taking? Authorizing Provider  acetaminophen (TYLENOL) 500 MG tablet Take 500 mg by mouth every 6 (six) hours as needed for mild pain.  06/30/17   Lada, Janit Bern, MD  atorvastatin (LIPITOR) 20 MG tablet TAKE 1 TABLET BY MOUTH AT BEDTIME DAILY. Patient taking differently: Take 20 mg by mouth at bedtime.  01/10/18   Lada, Janit Bern, MD  brimonidine (ALPHAGAN) 0.2 % ophthalmic solution Place 1 drop into both eyes 2 (two) times daily.    [provider]  dextromethorphan-guaiFENesin (ROBITUSSIN-DM) 10-100 MG/5ML liquid Take 5 mLs by mouth every 4 (four) hours as needed for cough.    [provider]  dorzolamide-timolol (COSOPT) 22.3-6.8 MG/ML ophthalmic solution Place 1 drop into both eyes 2 (two) times daily.    [provider]  latanoprost (XALATAN) 0.005 % ophthalmic solution Place 1 drop into both eyes daily. 03/12/17   [provider]  Melatonin 3 MG TABS One by mouth every night at 9 pm Patient taking differently: Take 3 mg by mouth at bedtime.  02/06/18   Lada, Janit Bern, MD  memantine (NAMENDA) 10 MG tablet TAKE 1 TABLET BY MOUTH TWICE DAILY FOR DEMENTIA Patient taking differently: Take 10 mg by mouth 2 (two) times daily.  04/12/18   Lada, Janit Bern, MD  Netarsudil Dimesylate (RHOPRESSA) 0.02 % SOLN Apply 1 drop to eye daily. drp into both eyes daily at 2pm    [provider]  traMADol (ULTRAM) 50 MG tablet Take 50 mg by mouth 3 (three) times daily as needed for moderate pain or severe pain.    [provider]  VITAMIN  B12 TR 1000 MCG TBCR TAKE 1 TABLET BY MOUTH EACH MORNING FOR SUPPLEMENT Patient taking differently: Take 500 mcg by mouth daily.  11/10/17   Kerman Passey, MD  Vitamin D, Ergocalciferol, (DRISDOL) 50000 units CAPS capsule Take 50,000 Units by mouth every 7 (seven) days.    [provider]    Allergies    Patient has no known allergies.  Review of Systems   Review of Systems  Unable to perform ROS: Dementia  level 5 caveat - dementia    Physical Exam Updated Vital Signs BP (!) 107/49 (BP Location: Right Arm)   Pulse (!) 59   Temp 98.6 F (37 C) (Axillary)   Resp 18   Ht 1.6 m (5\' 3" )   Wt 63.4 kg   SpO2 99%   BMI 24.76 kg/m   Physical Exam Vitals and nursing note reviewed.  Constitutional:      Appearance: Normal appearance. She is well-developed.  HENT:     Head:     Comments: Small contusion to forehead. Facial bones and orbits grossly intact.     Nose: Nose normal.     Mouth/Throat:     Mouth: Mucous membranes are moist.  Eyes:     General: No scleral icterus.    Extraocular Movements: Extraocular movements intact.     Conjunctiva/sclera: Conjunctivae normal.     Pupils: Pupils are equal, round, and reactive to light.  Neck:     Trachea: No tracheal deviation.  Cardiovascular:     Rate and Rhythm: Normal rate and regular rhythm.     Pulses: Normal pulses.     Heart sounds: Normal heart sounds. No murmur heard.  No friction rub. No gallop.   Pulmonary:     Effort: Pulmonary effort is normal. No respiratory distress.     Breath sounds: Normal breath sounds.  Chest:     Chest wall: No tenderness.  Abdominal:     General: Bowel sounds are normal. There is no distension.     Palpations: Abdomen is soft.     Tenderness: There is no abdominal tenderness. There is no guarding.     Comments: No abd bruising or contusion  Genitourinary:    Comments: No cva tenderness.  Musculoskeletal:        General: No swelling or tenderness.     Cervical back:  Normal range of motion and neck supple. No rigidity. No muscular tenderness.     Comments: CTLS spine, non tender, aligned, no step off. Good rom neck without apparent pain or discomfort. Good rom bil extremities without pain or focal bony tenderness.   Skin:    General: Skin is warm  and dry.     Findings: No rash.  Neurological:     Mental Status: She is alert.     Comments: Alert, content appearing. Moves bilateral extremities purposefully with good strength. Dementia w mental status described as c/w baseline.   Psychiatric:        Mood and Affect: Mood normal.     ED Results / Procedures / Treatments   Labs (all labs ordered are listed, but only abnormal results are displayed) Labs Reviewed - No data to display  EKG None  Radiology No results found.  Procedures Procedures (including critical care time)  Medications Ordered in ED Medications  acetaminophen (TYLENOL) tablet 650 mg (650 mg Oral Given 04/24/20 2002)    ED Course  I have reviewed the triage vital signs and the nursing notes.  Pertinent labs & imaging results that were available during my care of the patient were reviewed by me and considered in my medical decision making (see chart for details).    MDM Rules/Calculators/A&P                          Pt content appearing. Does have small contusion to forehead. No noted loc or change in mental status since fall, no vomiting. Pt appears in on acute pain or discomfort, and moves neck and extremities freely, comfortably. No anticoagulant use.   Reviewed nursing notes and prior charts for additional history.   Icepack to forehead contusion. Acetaminophen po. Po fluids.   Recheck pt, remains content, alert, no in pain/discomfort, no nv.   Patient currently appears stable for d/c.   Fall precautions. Return precautions provided.    Final Clinical Impression(s) / ED Diagnoses Final diagnoses:  None    Rx / DC Orders ED Discharge Orders    None        Cathren Laine, MD 04/24/20 2041

## 2020-04-24 NOTE — ED Notes (Signed)
[facility] called to transport pt 

## 2020-04-24 NOTE — Discharge Instructions (Addendum)
It was our pleasure to provide your ER care today - we hope that you feel better.  Fall precautions.   Return to ER if worse, new symptoms, change in mental status, new or severe pain, persistent vomiting, or other concern.

## 2020-06-02 DEATH — deceased
